# Patient Record
Sex: Male | Born: 1965 | Race: White | Hispanic: No | Marital: Single | Smoking: Current every day smoker
Health system: Southern US, Community
[De-identification: ages and names within clinical notes are randomized; demographics above are authoritative.]

## PROBLEM LIST (undated history)

## (undated) ENCOUNTER — Emergency Department (HOSPITAL_COMMUNITY): Payer: Self-pay | Source: Home / Self Care

## (undated) DIAGNOSIS — S82409A Unspecified fracture of shaft of unspecified fibula, initial encounter for closed fracture: Secondary | ICD-10-CM

## (undated) DIAGNOSIS — S82209A Unspecified fracture of shaft of unspecified tibia, initial encounter for closed fracture: Secondary | ICD-10-CM

## (undated) HISTORY — PX: BRONCHOSCOPY: SUR163

## (undated) HISTORY — PX: COLONOSCOPY: SHX174

---

## 1999-10-13 ENCOUNTER — Encounter (INDEPENDENT_AMBULATORY_CARE_PROVIDER_SITE_OTHER): Payer: Self-pay | Admitting: Specialist

## 1999-10-13 ENCOUNTER — Ambulatory Visit: Admission: RE | Admit: 1999-10-13 | Discharge: 1999-10-13 | Payer: Self-pay | Admitting: Pulmonary Disease

## 2009-06-16 ENCOUNTER — Emergency Department (HOSPITAL_COMMUNITY): Admission: EM | Admit: 2009-06-16 | Discharge: 2009-06-16 | Payer: Self-pay | Admitting: Emergency Medicine

## 2010-04-05 LAB — POCT I-STAT, CHEM 8
BUN: 6 mg/dL (ref 6–23)
Calcium, Ion: 1.08 mmol/L — ABNORMAL LOW (ref 1.12–1.32)
Chloride: 108 mEq/L (ref 96–112)
Creatinine, Ser: 1.1 mg/dL (ref 0.4–1.5)
Glucose, Bld: 89 mg/dL (ref 70–99)
Hemoglobin: 15 g/dL (ref 13.0–17.0)
TCO2: 22 mmol/L (ref 0–100)

## 2010-04-05 LAB — DIFFERENTIAL
Basophils Absolute: 0 10*3/uL (ref 0.0–0.1)
Eosinophils Absolute: 0 10*3/uL (ref 0.0–0.7)
Lymphs Abs: 1.6 10*3/uL (ref 0.7–4.0)
Monocytes Absolute: 0.7 10*3/uL (ref 0.1–1.0)
Neutrophils Relative %: 80 % — ABNORMAL HIGH (ref 43–77)

## 2010-04-05 LAB — CBC
Hemoglobin: 14.7 g/dL (ref 13.0–17.0)
MCHC: 34.3 g/dL (ref 30.0–36.0)
RBC: 4.51 MIL/uL (ref 4.22–5.81)

## 2010-04-05 LAB — ETHANOL: Alcohol, Ethyl (B): 61 mg/dL — ABNORMAL HIGH (ref 0–10)

## 2010-06-04 NOTE — Procedures (Signed)
[facility]  Patient:    Evan Valdez, Evan Valdez                            MRN: [id number] Proc. Date: 10/15/99 Adm. Date:  [id number] Disc. Date: [id number] Attending:  Merwyn Katos                           Procedure Report  DATE OF BIRTH:  1965-01-28  PROCEDURE:  Bronchoscopy.  INDICATIONS FOR PROCEDURE: 1. Persistent right upper lobe infiltrate, rule out tuberculosis versus    endobronchial obstruction. 2. Hemoptysis.  PREMEDICATION:  The patient was administered Versed 10 mg and Demerol 75 mg IV throughout the course of the procedure.  ANESTHESIA:  Topical anesthesia was applied to the nose and throat and 40 cc of 1% lidocaine were used during the course of the procedure.  DESCRIPTION OF PROCEDURE:  After adequate sedation and anesthesia, the bronchoscope was introduced via the right naris and advanced down the posterior pharynx and demonstrated a normal upper airway anatomy with vocal cords moving symmetrically. The patient had excessive cough. Further anesthesia was achieved with 1% lidocaine and with some difficulty, the cord were passed. Further airway anesthesia was achieved with 1% lidocaine. However, he could never be fully anesthetized and continued to have excessive cough and agitation throughout the course of the procedure. A complete airway examination was performed and this demonstrated severe diffuse acute and chronic bronchitis changes. His segmental airway anatomy was normal. There were no endobronchial tumors, masses or foreign bodies. There was no evidence of active hemoptysis. After completion of the airway examination, the bronchoscope was advanced into the posterior segment of the right upper lobe and bronchoalveolar lavage was performed. Then 100 cc of normal saline were instilled and approximately 25 cc returned. The returning fluid was opaque. Because of agitation, brush biopsy and transbronchial biopsies could not  be performed. The procedure was terminated. The patient tolerated the procedure well without significant complications. DD:  10/15/99 TD:  10/15/99 Job: [id number]

## 2011-06-13 ENCOUNTER — Encounter (HOSPITAL_COMMUNITY): Payer: Self-pay | Admitting: Emergency Medicine

## 2011-06-13 ENCOUNTER — Emergency Department (HOSPITAL_COMMUNITY)
Admission: EM | Admit: 2011-06-13 | Discharge: 2011-06-13 | Disposition: A | Discharge status: Home / Self Care | Payer: Self-pay | Attending: Emergency Medicine | Admitting: Emergency Medicine

## 2011-06-13 ENCOUNTER — Emergency Department (HOSPITAL_COMMUNITY): Payer: Self-pay

## 2011-06-13 DIAGNOSIS — S81019A Laceration without foreign body, unspecified knee, initial encounter: Secondary | ICD-10-CM

## 2011-06-13 DIAGNOSIS — S81809A Unspecified open wound, unspecified lower leg, initial encounter: Secondary | ICD-10-CM | POA: Insufficient documentation

## 2011-06-13 DIAGNOSIS — W298XXA Contact with other powered powered hand tools and household machinery, initial encounter: Secondary | ICD-10-CM | POA: Insufficient documentation

## 2011-06-13 DIAGNOSIS — S81009A Unspecified open wound, unspecified knee, initial encounter: Secondary | ICD-10-CM | POA: Insufficient documentation

## 2011-06-13 MED ORDER — ONDANSETRON HCL 4 MG PO TABS
4.0000 mg | ORAL_TABLET | Freq: Once | ORAL | Status: AC
  Administered 2011-06-13: 4 mg via ORAL
  Filled 2011-06-13: qty 1

## 2011-06-13 MED ORDER — MORPHINE SULFATE 10 MG/ML IJ SOLN
10.0000 mg | Freq: Once | INTRAMUSCULAR | Status: AC
  Administered 2011-06-13: 10 mg via INTRAMUSCULAR
  Filled 2011-06-13: qty 1

## 2011-06-13 MED ORDER — LIDOCAINE-EPINEPHRINE (PF) 2 %-1:200000 IJ SOLN
INTRAMUSCULAR | Status: AC
  Administered 2011-06-13: 12:00:00
  Filled 2011-06-13: qty 20

## 2011-06-13 MED ORDER — BACITRACIN ZINC 500 UNIT/GM EX OINT
TOPICAL_OINTMENT | CUTANEOUS | Status: AC
  Administered 2011-06-13: 1
  Filled 2011-06-13: qty 0.9

## 2011-06-13 MED ORDER — AMOXICILLIN-POT CLAVULANATE 875-125 MG PO TABS
1.0000 | ORAL_TABLET | Freq: Once | ORAL | Status: AC
  Administered 2011-06-13: 1 via ORAL
  Filled 2011-06-13: qty 1

## 2011-06-13 MED ORDER — HYDROCODONE-ACETAMINOPHEN 7.5-325 MG PO TABS: 1.0000 | ORAL_TABLET | ORAL | Status: AC | PRN

## 2011-06-13 MED ORDER — AMOXICILLIN 500 MG PO CAPS: ORAL_CAPSULE | ORAL | Status: DC

## 2011-06-13 NOTE — ED Notes (Addendum)
Chain saw lac to L knee. Gapping 2inch lac noted with bleeding controlled. rom wnl. Color wnl. edp in no bone exposure but appears deep. Plus 2 pulse bilateral and bounding

## 2011-06-13 NOTE — Discharge Instructions (Signed)
Please keep wound clean and dry. Return to your MD or ED to Laceration Care, Adult PLEASE USE THE KNEE IMMOBILIZER FOR THE NEXT 6 DAYS. A laceration is a cut or lesion that goes through all layers of the skin and into the tissue just beneath the skin. TREATMENT  Some lacerations may not require closure. Some lacerations may not be able to be closed due to an increased risk of infection. It is important to see your caregiver as soon as possible after an injury to minimize the risk of infection and maximize the opportunity for successful closure. If closure is appropriate, pain medicines may be given, if needed. The wound will be cleaned to help prevent infection. Your caregiver will use stitches (sutures), staples, wound glue (adhesive), or skin adhesive strips to repair the laceration. These tools bring the skin edges together to allow for faster healing and a better cosmetic outcome. However, all wounds will heal with a scar. Once the wound has healed, scarring can be minimized by covering the wound with sunscreen during the day for 1 full year. HOME CARE INSTRUCTIONS  For sutures or staples:  Keep the wound clean and dry.   If you were given a bandage (dressing), you should change it at least once a day. Also, change the dressing if it becomes wet or dirty, or as directed by your caregiver.   Wash the wound with soap and water 2 times a day. Rinse the wound off with water to remove all soap. Pat the wound dry with a clean towel.   After cleaning, apply a thin layer of the antibiotic ointment as recommended by your caregiver. This will help prevent infection and keep the dressing from sticking.   You may shower as usual after the first 24 hours. Do not soak the wound in water until the sutures are removed.   Only take over-the-counter or prescription medicines for pain, discomfort, or fever as directed by your caregiver.   Get your sutures or staples removed as directed by your caregiver.  For  skin adhesive strips:  Keep the wound clean and dry.   Do not get the skin adhesive strips wet. You may bathe carefully, using caution to keep the wound dry.   If the wound gets wet, pat it dry with a clean towel.   Skin adhesive strips will fall off on their own. You may trim the strips as the wound heals. Do not remove skin adhesive strips that are still stuck to the wound. They will fall off in time.  For wound adhesive:  You may briefly wet your wound in the shower or bath. Do not soak or scrub the wound. Do not swim. Avoid periods of heavy perspiration until the skin adhesive has fallen off on its own. After showering or bathing, gently pat the wound dry with a clean towel.   Do not apply liquid medicine, cream medicine, or ointment medicine to your wound while the skin adhesive is in place. This may loosen the film before your wound is healed.   If a dressing is placed over the wound, be careful not to apply tape directly over the skin adhesive. This may cause the adhesive to be pulled off before the wound is healed.   Avoid prolonged exposure to sunlight or tanning lamps while the skin adhesive is in place. Exposure to ultraviolet light in the first year will darken the scar.   The skin adhesive will usually remain in place for 5 to 10 days,  then naturally fall off the skin. Do not pick at the adhesive film.  You may need a tetanus shot if:  You cannot remember when you had your last tetanus shot.   You have never had a tetanus shot.  If you get a tetanus shot, your arm may swell, get red, and feel warm to the touch. This is common and not a problem. If you need a tetanus shot and you choose not to have one, there is a rare chance of getting tetanus. Sickness from tetanus can be serious. SEEK MEDICAL CARE IF:   You have redness, swelling, or increasing pain in the wound.   You see a red line that goes away from the wound.   You have yellowish-white fluid (pus) coming from the  wound.   You have a fever.   You notice a bad smell coming from the wound or dressing.   Your wound breaks open before or after sutures have been removed.   You notice something coming out of the wound such as wood or glass.   Your wound is on your hand or foot and you cannot move a finger or toe.  SEEK IMMEDIATE MEDICAL CARE IF:   Your pain is not controlled with prescribed medicine.   You have severe swelling around the wound causing pain and numbness or a change in color in your arm, hand, leg, or foot.   Your wound splits open and starts bleeding.   You have worsening numbness, weakness, or loss of function of any joint around or beyond the wound.   You develop painful lumps near the wound or on the skin anywhere on your body.  MAKE SURE YOU:   Understand these instructions.   Will watch your condition.   Will get help right away if you are not doing well or get worse.  Document Released: 01/03/2005 Document Revised: 12/23/2010 Document Reviewed: 06/29/2010 [facility] Patient Information 2012 [place], [place]. Have staples removed in 10 days. Return if any signs of infection

## 2011-06-13 NOTE — ED Notes (Signed)
Pt back from xray and pa aware suture setup ready

## 2011-06-13 NOTE — ED Provider Notes (Addendum)
History     CSN: [id number]  Arrival date & time 06/13/11  2952   First MD Initiated Contact with Patient 06/13/11 0940      No chief complaint on file.   (Consider location/radiation/quality/duration/timing/severity/associated sxs/prior treatment) HPI Comments: Pt was working with a chain saw an accidentally cut the left knee. He denies use of blood thinning medications. He is up to date on tetanus. He has not taken anything for pain. Bleeding controlled upon arrival.  The history is provided by the patient.    History reviewed. No pertinent past medical history.  History reviewed. No pertinent past surgical history.  History reviewed. No pertinent family history.  History  Substance Use Topics  . Smoking status: Current Everyday Smoker  . Smokeless tobacco: Not on file  . Alcohol Use: Yes     occasional      Review of Systems  Constitutional: Negative for activity change.       All ROS Neg except as noted in HPI  HENT: Negative for nosebleeds and neck pain.   Eyes: Negative for photophobia and discharge.  Respiratory: Negative for cough, shortness of breath and wheezing.   Cardiovascular: Negative for chest pain and palpitations.  Gastrointestinal: Negative for abdominal pain and blood in stool.  Genitourinary: Negative for dysuria, frequency and hematuria.  Musculoskeletal: Negative for back pain and arthralgias.  Skin: Negative.   Neurological: Negative for dizziness, seizures and speech difficulty.  Psychiatric/Behavioral: Negative for hallucinations and confusion.    Allergies  Review of patient's allergies indicates no known allergies.  Home Medications  No current outpatient prescriptions on file.  BP 105/71  Pulse 66  Temp(Src) 97.7 F (36.5 C) (Oral)  Resp 20  Ht 5\' 11"  (1.803 m)  Wt 160 lb (72.576 kg)  BMI 22.32 kg/m2  SpO2 99%  Physical Exam  Nursing note and vitals reviewed. Constitutional: He is oriented to person, place, and time. He  appears well-developed and well-nourished.  Non-toxic appearance.  HENT:  Head: Normocephalic.  Right Ear: Tympanic membrane and external ear normal.  Left Ear: Tympanic membrane and external ear normal.  Eyes: EOM and lids are normal. Pupils are equal, round, and reactive to light.  Neck: Normal range of motion. Neck supple. Carotid bruit is not present.  Cardiovascular: Normal rate, regular rhythm, normal heart sounds, intact distal pulses and normal pulses.   Pulmonary/Chest: Breath sounds normal. No respiratory distress.  Abdominal: Soft. Bowel sounds are normal. There is no tenderness. There is no guarding.  Musculoskeletal: Normal range of motion.       The patient has a laceration to the anterior left knee. There is no tendon involvement. No noted bone involvement. There is no crepitus appreciated. The distal pulses are symmetrical. Sensory is also symmetrical.  Lymphadenopathy:       Head (right side): No submandibular adenopathy present.       Head (left side): No submandibular adenopathy present.    He has no cervical adenopathy.  Neurological: He is alert and oriented to person, place, and time. He has normal strength. No cranial nerve deficit or sensory deficit.  Skin: Skin is warm and dry.  Psychiatric: He has a normal mood and affect. His speech is normal.    ED Course  Procedures: LACERATION REPAIR - the patient was identified by arm band. Permission for the procedure was given by the patient. Time out was taken before starting the procedure. The laceration to the left knee was painted with Betadine. It was then infiltrated  with 2% lidocaine with epinephrine. Following this it was cleansed with a Betadine surgical scrub brush. Was then irrigated with sterile saline. Following this the wound was again painted with Betadine. Draped in the usual sterile fashion. Subcutaneous area was repaired with FOUR 3-0 chromic gut sutures. The skin was then repaired with 12 staples. The woud  measures 6.7cmSterile dressing was applied by me. Patient tolerated the procedure without problem or incident. Knee immobilizer was applied by nursing staff.  Labs Reviewed - No data to display No results found.   No diagnosis found.    MDM  I have reviewed nursing notes, vital signs, and all appropriate lab and imaging results for this patient. The x-ray of the left knee is negative for air or bone involvement. The complex laceration was repaired with 3-0 chromic gut sutures and staples. The patient is to return in 10 days for staples to be removed. Patient is treated with Augmentin twice daily and Norco 7.5 every 4 hours as needed for pain. The patient is fitted with a knee immobilizer to prevent excess strain on the laceration sites.       Kathie Dike, PA 06/22/11 2028  Kathie Dike, PA 08/29/11 1610

## 2011-06-23 NOTE — ED Provider Notes (Signed)
Medical screening examination/treatment/procedure(s) were conducted as a shared visit with non-physician practitioner(s) and myself.  I personally evaluated the patient during the encounter  Knee laceration from chainsaw. Bleeding controlled.  Flexion/extension intact. +2 DP and PT pulses. Does not appear to violate joint space.  [name], MD 06/23/11 1306

## 2011-08-30 NOTE — ED Provider Notes (Signed)
Medical screening examination/treatment/procedure(s) were performed by non-physician practitioner and as supervising physician I was immediately available for consultation/collaboration.   Mercedez Boule, MD 08/30/11 1416 

## 2012-08-09 ENCOUNTER — Encounter (HOSPITAL_COMMUNITY): Payer: Self-pay | Admitting: *Deleted

## 2012-08-09 ENCOUNTER — Emergency Department (HOSPITAL_COMMUNITY)
Admission: EM | Admit: 2012-08-09 | Discharge: 2012-08-09 | Disposition: A | Discharge status: Home / Self Care | Payer: Self-pay | Attending: Emergency Medicine | Admitting: Emergency Medicine

## 2012-08-09 ENCOUNTER — Emergency Department (HOSPITAL_COMMUNITY): Payer: Self-pay

## 2012-08-09 DIAGNOSIS — S5291XA Unspecified fracture of right forearm, initial encounter for closed fracture: Secondary | ICD-10-CM

## 2012-08-09 DIAGNOSIS — F17200 Nicotine dependence, unspecified, uncomplicated: Secondary | ICD-10-CM | POA: Insufficient documentation

## 2012-08-09 DIAGNOSIS — S0101XA Laceration without foreign body of scalp, initial encounter: Secondary | ICD-10-CM

## 2012-08-09 DIAGNOSIS — Z23 Encounter for immunization: Secondary | ICD-10-CM | POA: Insufficient documentation

## 2012-08-09 DIAGNOSIS — S0100XA Unspecified open wound of scalp, initial encounter: Secondary | ICD-10-CM | POA: Insufficient documentation

## 2012-08-09 DIAGNOSIS — S52209A Unspecified fracture of shaft of unspecified ulna, initial encounter for closed fracture: Secondary | ICD-10-CM | POA: Insufficient documentation

## 2012-08-09 DIAGNOSIS — S0990XA Unspecified injury of head, initial encounter: Secondary | ICD-10-CM | POA: Insufficient documentation

## 2012-08-09 LAB — CBC WITH DIFFERENTIAL/PLATELET
Basophils Absolute: 0.1 10*3/uL (ref 0.0–0.1)
Basophils Relative: 1 % (ref 0–1)
Lymphocytes Relative: 25 % (ref 12–46)
MCHC: 35.3 g/dL (ref 30.0–36.0)
Neutro Abs: 6.7 10*3/uL (ref 1.7–7.7)
Neutrophils Relative %: 67 % (ref 43–77)
Platelets: 232 10*3/uL (ref 150–400)
RDW: 12.7 % (ref 11.5–15.5)
WBC: 9.9 10*3/uL (ref 4.0–10.5)

## 2012-08-09 LAB — COMPREHENSIVE METABOLIC PANEL
ALT: 43 U/L (ref 0–53)
AST: 43 U/L — ABNORMAL HIGH (ref 0–37)
Albumin: 4.2 g/dL (ref 3.5–5.2)
CO2: 26 mEq/L (ref 19–32)
Chloride: 105 mEq/L (ref 96–112)
Creatinine, Ser: 0.93 mg/dL (ref 0.50–1.35)
GFR calc non Af Amer: >90 mL/min (ref >90)
Sodium: 141 mEq/L (ref 135–145)
Total Bilirubin: 0.6 mg/dL (ref 0.3–1.2)

## 2012-08-09 MED ORDER — ONDANSETRON HCL 4 MG/2ML IJ SOLN
4.0000 mg | Freq: Once | INTRAMUSCULAR | Status: AC
  Administered 2012-08-09: 4 mg via INTRAVENOUS
  Filled 2012-08-09: qty 2

## 2012-08-09 MED ORDER — CEFAZOLIN SODIUM-DEXTROSE 2-3 GM-% IV SOLR
2.0000 g | Freq: Once | INTRAVENOUS | Status: DC
  Filled 2012-08-09: qty 50

## 2012-08-09 MED ORDER — HYDROCODONE-ACETAMINOPHEN 5-325 MG PO TABS: 1.0000 | ORAL_TABLET | ORAL | Status: DC | PRN

## 2012-08-09 MED ORDER — MORPHINE SULFATE 4 MG/ML IJ SOLN
4.0000 mg | Freq: Once | INTRAMUSCULAR | Status: AC
  Administered 2012-08-09: 4 mg via INTRAVENOUS
  Filled 2012-08-09: qty 1

## 2012-08-09 MED ORDER — SODIUM CHLORIDE 0.9 % IV SOLN
INTRAVENOUS | Status: DC
  Administered 2012-08-09: 15:00:00 via INTRAVENOUS
  Filled 2012-08-09: qty 1000

## 2012-08-09 MED ORDER — LIDOCAINE-EPINEPHRINE (PF) 2 %-1:200000 IJ SOLN
INTRAMUSCULAR | Status: AC
  Filled 2012-08-09: qty 20

## 2012-08-09 MED ORDER — CEFAZOLIN SODIUM-DEXTROSE 2-3 GM-% IV SOLR
INTRAVENOUS | Status: AC
  Filled 2012-08-09: qty 50

## 2012-08-09 MED ORDER — LIDOCAINE-EPINEPHRINE (PF) 2 %-1:200000 IJ SOLN
INTRAMUSCULAR | Status: AC
  Administered 2012-08-09: 20 mL
  Filled 2012-08-09: qty 20

## 2012-08-09 MED ORDER — TETANUS-DIPHTHERIA TOXOIDS TD 5-2 LFU IM INJ
0.5000 mL | INJECTION | Freq: Once | INTRAMUSCULAR | Status: AC
  Administered 2012-08-09: 0.5 mL via INTRAMUSCULAR
  Filled 2012-08-09: qty 0.5
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
## 2012-08-09 NOTE — ED Notes (Signed)
Patient transported to X-ray 
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
## 2012-08-09 NOTE — ED Notes (Signed)
Patient transported to CT 

## 2012-08-10 ENCOUNTER — Observation Stay (HOSPITAL_COMMUNITY)
Admission: RE | Admit: 2012-08-10 | Discharge: 2012-08-11 | Disposition: A | Discharge status: Home / Self Care | Payer: MEDICAID | Source: Ambulatory Visit | Attending: Orthopaedic Surgery | Admitting: Orthopaedic Surgery

## 2012-08-10 ENCOUNTER — Encounter (HOSPITAL_COMMUNITY): Payer: Self-pay | Admitting: Anesthesiology

## 2012-08-10 ENCOUNTER — Encounter (HOSPITAL_COMMUNITY): Payer: Self-pay

## 2012-08-10 ENCOUNTER — Ambulatory Visit (HOSPITAL_COMMUNITY): Payer: Self-pay | Admitting: Anesthesiology

## 2012-08-10 ENCOUNTER — Ambulatory Visit (HOSPITAL_COMMUNITY): Payer: Self-pay

## 2012-08-10 ENCOUNTER — Encounter (HOSPITAL_COMMUNITY)
Admission: RE | Disposition: A | Discharge status: Home / Self Care | Payer: Self-pay | Source: Ambulatory Visit | Attending: Orthopaedic Surgery

## 2012-08-10 DIAGNOSIS — S52201D Unspecified fracture of shaft of right ulna, subsequent encounter for closed fracture with routine healing: Secondary | ICD-10-CM

## 2012-08-10 DIAGNOSIS — S52009A Unspecified fracture of upper end of unspecified ulna, initial encounter for closed fracture: Principal | ICD-10-CM | POA: Insufficient documentation

## 2012-08-10 DIAGNOSIS — X58XXXA Exposure to other specified factors, initial encounter: Secondary | ICD-10-CM | POA: Insufficient documentation

## 2012-08-10 DIAGNOSIS — Z01812 Encounter for preprocedural laboratory examination: Secondary | ICD-10-CM | POA: Insufficient documentation

## 2012-08-10 HISTORY — PX: ORIF ULNAR FRACTURE: SHX5417

## 2012-08-10 SURGERY — OPEN REDUCTION INTERNAL FIXATION (ORIF) ULNAR FRACTURE
Anesthesia: General | Site: Arm Lower | Laterality: Right | Wound class: Clean

## 2012-08-10 MED ORDER — ONDANSETRON HCL 4 MG/2ML IJ SOLN: 4.0000 mg | Freq: Once | INTRAMUSCULAR | Status: DC | PRN

## 2012-08-10 MED ORDER — DIPHENHYDRAMINE HCL 50 MG/ML IJ SOLN: 12.5000 mg | Freq: Four times a day (QID) | INTRAMUSCULAR | Status: DC | PRN

## 2012-08-10 MED ORDER — SODIUM CHLORIDE 0.9 % IJ SOLN: 9.0000 mL | INTRAMUSCULAR | Status: DC | PRN

## 2012-08-10 MED ORDER — FENTANYL CITRATE 0.05 MG/ML IJ SOLN
25.0000 ug | INTRAMUSCULAR | Status: AC
  Administered 2012-08-10 (×2): 25 ug via INTRAVENOUS

## 2012-08-10 MED ORDER — ENOXAPARIN SODIUM 40 MG/0.4ML ~~LOC~~ SOLN
40.0000 mg | SUBCUTANEOUS | Status: DC
  Administered 2012-08-11: 40 mg via SUBCUTANEOUS
  Filled 2012-08-10: qty 0.4

## 2012-08-10 MED ORDER — ALBUTEROL SULFATE (5 MG/ML) 0.5% IN NEBU: 2.5000 mg | INHALATION_SOLUTION | Freq: Four times a day (QID) | RESPIRATORY_TRACT | Status: DC | PRN

## 2012-08-10 MED ORDER — 0.9 % SODIUM CHLORIDE (POUR BTL) OPTIME
TOPICAL | Status: DC | PRN
  Administered 2012-08-10: 1000 mL

## 2012-08-10 MED ORDER — MIDAZOLAM HCL 2 MG/2ML IJ SOLN
1.0000 mg | INTRAMUSCULAR | Status: DC | PRN
  Administered 2012-08-10: 2 mg via INTRAVENOUS

## 2012-08-10 MED ORDER — FENTANYL CITRATE 0.05 MG/ML IJ SOLN
INTRAMUSCULAR | Status: AC
  Filled 2012-08-10: qty 2

## 2012-08-10 MED ORDER — PROMETHAZINE HCL 25 MG/ML IJ SOLN
12.5000 mg | INTRAMUSCULAR | Status: DC | PRN
  Administered 2012-08-11: 12.5 mg via INTRAMUSCULAR
  Filled 2012-08-10: qty 1

## 2012-08-10 MED ORDER — LACTATED RINGERS IV SOLN
INTRAVENOUS | Status: DC
  Administered 2012-08-10: 10:00:00 via INTRAVENOUS

## 2012-08-10 MED ORDER — LIDOCAINE HCL (CARDIAC) 10 MG/ML IV SOLN
INTRAVENOUS | Status: DC | PRN
  Administered 2012-08-10: 20 mg via INTRAVENOUS

## 2012-08-10 MED ORDER — ONDANSETRON HCL 4 MG/2ML IJ SOLN: 4.0000 mg | Freq: Four times a day (QID) | INTRAMUSCULAR | Status: DC | PRN

## 2012-08-10 MED ORDER — MORPHINE SULFATE (PF) 1 MG/ML IV SOLN
INTRAVENOUS | Status: DC
Start: 2012-08-10 — End: 2012-08-11
  Administered 2012-08-10 – 2012-08-11 (×3): via INTRAVENOUS
  Administered 2012-08-11: 44.16 mg via INTRAVENOUS
  Administered 2012-08-11: 5.47 mg via INTRAVENOUS
  Administered 2012-08-11: 02:00:00 via INTRAVENOUS
  Administered 2012-08-11: 24.86 mg via INTRAVENOUS
  Filled 2012-08-10 (×4): qty 25

## 2012-08-10 MED ORDER — MIDAZOLAM HCL 2 MG/2ML IJ SOLN
INTRAMUSCULAR | Status: AC
  Filled 2012-08-10: qty 2

## 2012-08-10 MED ORDER — FENTANYL CITRATE 0.05 MG/ML IJ SOLN
25.0000 ug | INTRAMUSCULAR | Status: DC | PRN
  Administered 2012-08-10 (×4): 50 ug via INTRAVENOUS

## 2012-08-10 MED ORDER — MAGNESIUM HYDROXIDE 400 MG/5ML PO SUSP: 30.0000 mL | Freq: Every day | ORAL | Status: DC | PRN

## 2012-08-10 MED ORDER — ALBUTEROL SULFATE (5 MG/ML) 0.5% IN NEBU
2.5000 mg | INHALATION_SOLUTION | Freq: Four times a day (QID) | RESPIRATORY_TRACT | Status: DC
  Administered 2012-08-10 (×2): 2.5 mg via RESPIRATORY_TRACT
  Filled 2012-08-10 (×2): qty 0.5

## 2012-08-10 MED ORDER — FENTANYL CITRATE 0.05 MG/ML IJ SOLN
INTRAMUSCULAR | Status: DC | PRN
  Administered 2012-08-10 (×4): 50 ug via INTRAVENOUS
  Administered 2012-08-10: 100 ug via INTRAVENOUS

## 2012-08-10 MED ORDER — NALOXONE HCL 0.4 MG/ML IJ SOLN: 0.4000 mg | INTRAMUSCULAR | Status: DC | PRN

## 2012-08-10 MED ORDER — DEXTROSE-NACL 5-0.45 % IV SOLN
INTRAVENOUS | Status: DC
  Administered 2012-08-10 – 2012-08-11 (×2): via INTRAVENOUS

## 2012-08-10 MED ORDER — ACETAMINOPHEN 10 MG/ML IV SOLN
1000.0000 mg | Freq: Four times a day (QID) | INTRAVENOUS | Status: DC
  Administered 2012-08-10 – 2012-08-11 (×3): 1000 mg via INTRAVENOUS
  Filled 2012-08-10 (×4): qty 100

## 2012-08-10 MED ORDER — CEFAZOLIN SODIUM 1-5 GM-% IV SOLN
INTRAVENOUS | Status: DC | PRN
  Administered 2012-08-10: 1 g via INTRAVENOUS

## 2012-08-10 MED ORDER — ZOLPIDEM TARTRATE 5 MG PO TABS
5.0000 mg | ORAL_TABLET | Freq: Every day | ORAL | Status: DC
  Administered 2012-08-10: 5 mg via ORAL
  Filled 2012-08-10: qty 1

## 2012-08-10 MED ORDER — DIPHENHYDRAMINE HCL 12.5 MG/5ML PO ELIX: 12.5000 mg | ORAL_SOLUTION | Freq: Four times a day (QID) | ORAL | Status: DC | PRN

## 2012-08-10 MED ORDER — PROPOFOL 10 MG/ML IV BOLUS
INTRAVENOUS | Status: DC | PRN
  Administered 2012-08-10: 20 mg via INTRAVENOUS
  Administered 2012-08-10: 150 mg via INTRAVENOUS
  Administered 2012-08-10: 30 mg via INTRAVENOUS

## 2012-08-10 SURGICAL SUPPLY — 41 items
BAG HAMPER (MISCELLANEOUS) ×1 IMPLANT
BANDAGE ELASTIC 4 VELCRO NS (GAUZE/BANDAGES/DRESSINGS) ×2 IMPLANT
BIT DRILL 2.5X110 QC LCP DISP (BIT) ×1 IMPLANT
BIT DRILL 2.8 (BIT) ×1
BIT DRILL CANN QC 2.8X165 (BIT) IMPLANT
BNDG COHESIVE 4X5 TAN NS LF (GAUZE/BANDAGES/DRESSINGS) ×1 IMPLANT
CLOTH BEACON ORANGE TIMEOUT ST (SAFETY) ×1 IMPLANT
COVER LIGHT HANDLE STERIS (MISCELLANEOUS) ×4 IMPLANT
CUFF TOURNIQUET SINGLE 18IN (TOURNIQUET CUFF) ×1 IMPLANT
DRAPE C-ARM FOLDED MOBILE STRL (DRAPES) ×1 IMPLANT
DRAPE X-RAY CASS 24X20 (DRAPES) ×1 IMPLANT
DRILL BIT 2.8MM (BIT) ×2
GAUZE XEROFORM 5X9 LF (GAUZE/BANDAGES/DRESSINGS) ×1 IMPLANT
GLOVE BIO SURGEON STRL SZ8 (GLOVE) ×1 IMPLANT
GOWN STRL REIN XL XLG (GOWN DISPOSABLE) ×3 IMPLANT
MANIFOLD NEPTUNE II (INSTRUMENTS) ×1 IMPLANT
NS IRRIG 1000ML POUR BTL (IV SOLUTION) ×1 IMPLANT
PACK BASIC LIMB (CUSTOM PROCEDURE TRAY) ×1 IMPLANT
PAD ABD 5X9 TENDERSORB (GAUZE/BANDAGES/DRESSINGS) ×2 IMPLANT
PAD CAST 4YDX4 CTTN HI CHSV (CAST SUPPLIES) IMPLANT
PADDING CAST COTTON 4X4 STRL (CAST SUPPLIES) ×4
PROS LCP PLATE 6H 85MM (Plate) ×2 IMPLANT
PROSTHESIS LCP PLATE 6H 85MM (Plate) IMPLANT
SCREW CORTEX 3.5 20MM (Screw) ×1 IMPLANT
SCREW CORTEX 3.5 22MM (Screw) ×4 IMPLANT
SCREW LOCK CORT ST 3.5X20 (Screw) IMPLANT
SCREW LOCK CORT ST 3.5X22 (Screw) IMPLANT
SCREW LOCK T15 FT 40X3.5XST (Screw) IMPLANT
SCREW LOCKING 3.5X40 (Screw) ×2 IMPLANT
SET BASIN LINEN APH (SET/KITS/TRAYS/PACK) ×1 IMPLANT
SOL PREP PROV IODINE SCRUB 4OZ (MISCELLANEOUS) ×1 IMPLANT
SPLINT IMMOBILIZER J 3INX20FT (CAST SUPPLIES) ×1
SPLINT J IMMOBILIZER 3X20FT (CAST SUPPLIES) IMPLANT
SPONGE GAUZE 4X4 12PLY (GAUZE/BANDAGES/DRESSINGS) ×2 IMPLANT
SPONGE LAP 18X18 X RAY DECT (DISPOSABLE) ×1 IMPLANT
STAPLER VISISTAT 35W (STAPLE) ×1 IMPLANT
SUT CHROMIC 2 0 CT 1 (SUTURE) ×3 IMPLANT
SUT PLAIN CT 1/2CIR 2-0 27IN (SUTURE) ×1 IMPLANT
SYR BULB IRRIGATION 50ML (SYRINGE) ×1 IMPLANT
TOWEL OR 17X26 4PK STRL BLUE (TOWEL DISPOSABLE) ×1 IMPLANT
WATER STERILE IRR 1000ML POUR (IV SOLUTION) ×1 IMPLANT

## 2012-08-10 NOTE — Anesthesia Preprocedure Evaluation (Signed)
Anesthesia Evaluation  Patient identified by MRN, date of birth, ID band Patient awake    Reviewed: Allergy & Precautions, H&P , NPO status , Patient's Chart, lab work & pertinent test results  History of Anesthesia Complications Negative for: history of anesthetic complications  Airway Mallampati: II TM Distance: >3 FB     Dental  (+) Teeth Intact, Implants and Missing   Pulmonary Current Smoker (am cough),  breath sounds clear to auscultation        Cardiovascular negative cardio ROS  Rhythm:Regular Rate:Normal     Neuro/Psych No LOC     GI/Hepatic negative GI ROS,   Endo/Other    Renal/GU      Musculoskeletal   Abdominal   Peds  Hematology   Anesthesia Other Findings   Reproductive/Obstetrics                           Anesthesia Physical Anesthesia Plan  ASA: II  Anesthesia Plan: General   Post-op Pain Management:    Induction: Intravenous  Airway Management Planned: LMA  Additional Equipment:   Intra-op Plan:   Post-operative Plan: Extubation in OR  Informed Consent: I have reviewed the patients History and Physical, chart, labs and discussed the procedure including the risks, benefits and alternatives for the proposed anesthesia with the patient or authorized representative who has indicated his/her understanding and acceptance.     Plan Discussed with:   Anesthesia Plan Comments:         Anesthesia Quick Evaluation

## 2012-08-10 NOTE — Transfer of Care (Signed)
Immediate Anesthesia Transfer of Care Note  Patient: Evan Valdez  Procedure(s) Performed: Procedure(s) (LRB): OPEN REDUCTION INTERNAL FIXATION (ORIF) ULNAR FRACTURE (Right)  Patient Location: PACU  Anesthesia Type: General  Level of Consciousness: awake  Airway & Oxygen Therapy: Patient Spontanous Breathing and non-rebreather face mask  Post-op Assessment: Report given to PACU RN, Post -op Vital signs reviewed and stable and Patient moving all extremities  Post vital signs: Reviewed and stable  Complications: No apparent anesthesia complications

## 2012-08-10 NOTE — Anesthesia Postprocedure Evaluation (Signed)
  Anesthesia Post-op Note  Patient: Evan Valdez  Procedure(s) Performed: Procedure(s): OPEN REDUCTION INTERNAL FIXATION (ORIF) ULNAR FRACTURE (Right)  Patient Location: PACU  Anesthesia Type:General  Level of Consciousness: awake and patient cooperative  Airway and Oxygen Therapy: Patient Spontanous Breathing and non-rebreather face mask  Post-op Pain: moderate  Post-op Assessment: Post-op Vital signs reviewed, Patient's Cardiovascular Status Stable, Respiratory Function Stable and Patent Airway  Post-op Vital Signs: Reviewed and stable  Complications: No apparent anesthesia complications

## 2012-08-10 NOTE — Progress Notes (Signed)
The History and Physical is unchanged. I have examined the patient. The patient is medically able to have surgery on the right arm . Evan Valdez

## 2012-08-10 NOTE — Brief Op Note (Signed)
08/10/2012  12:49 PM  PATIENT:  Evan Valdez  47 y.o. male  PRE-OPERATIVE DIAGNOSIS:  Right proximal third mid third ulnar fracture  POST-OPERATIVE DIAGNOSIS:  Right proximal third mid third ulnar fracture  PROCEDURE:  Procedure(s): OPEN REDUCTION INTERNAL FIXATION (ORIF) ULNAR FRACTURE (Right)  SURGEON:  Surgeon(s) and Role:    * Darreld Mclean, MD - Primary  PHYSICIAN ASSISTANT:   ASSISTANTS: Wrenn   ANESTHESIA:   general  EBL:  Total I/O In: 500 [I.V.:500] Out: 0   BLOOD ADMINISTERED:none  DRAINS: none   LOCAL MEDICATIONS USED:  NONE  SPECIMEN:  No Specimen  DISPOSITION OF SPECIMEN:  N/A  COUNTS:  YES  TOURNIQUET:  * Missing tourniquet times found for documented tourniquets in log:  [id number] * Total Tourniquet Time Documented: Upper Arm (Right) - 2 minutes Total: Upper Arm (Right) - 2 minutes   DICTATION: .Other Dictation: Dictation Number [id number]  PLAN OF CARE: Admit for overnight observation  PATIENT DISPOSITION:  PACU - guarded condition.   Delay start of Pharmacological VTE agent (>24hrs) due to surgical blood loss or risk of bleeding: no

## 2012-08-10 NOTE — Anesthesia Procedure Notes (Signed)
Procedure Name: LMA Insertion Date/Time: 08/10/2012 11:31 AM Performed by: Franco Nones Pre-anesthesia Checklist: Patient identified, Patient being monitored, Emergency Drugs available, Timeout performed and Suction available Patient Re-evaluated:Patient Re-evaluated prior to inductionOxygen Delivery Method: Circle System Utilized Preoxygenation: Pre-oxygenation with 100% oxygen Intubation Type: IV induction Ventilation: Mask ventilation without difficulty LMA: LMA inserted LMA Size: 4.0 Number of attempts: 1 Placement Confirmation: positive ETCO2 and breath sounds checked- equal and bilateral
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
## 2012-08-11 LAB — CBC WITH DIFFERENTIAL/PLATELET
Basophils Relative: 0 % (ref 0–1)
Eosinophils Absolute: 0.1 10*3/uL (ref 0.0–0.7)
Eosinophils Relative: 1 % (ref 0–5)
Lymphs Abs: 1.3 10*3/uL (ref 0.7–4.0)
MCH: 32 pg (ref 26.0–34.0)
MCHC: 34.1 g/dL (ref 30.0–36.0)
MCV: 94.1 fL (ref 78.0–100.0)
Neutrophils Relative %: 78 % — ABNORMAL HIGH (ref 43–77)
Platelets: 214 10*3/uL (ref 150–400)
RBC: 4.4 MIL/uL (ref 4.22–5.81)

## 2012-08-11 MED ORDER — LORAZEPAM 2 MG/ML IJ SOLN
1.0000 mg | Freq: Once | INTRAMUSCULAR | Status: AC
  Administered 2012-08-11: 1 mg via INTRAVENOUS
  Filled 2012-08-11: qty 1

## 2012-08-11 MED ORDER — NICOTINE 21 MG/24HR TD PT24
21.0000 mg | MEDICATED_PATCH | Freq: Every day | TRANSDERMAL | Status: DC
  Administered 2012-08-11: 21 mg via TRANSDERMAL
  Filled 2012-08-11 (×2): qty 1

## 2012-08-11 MED ORDER — OXYCODONE-ACETAMINOPHEN 7.5-325 MG PO TABS: 1.0000 | ORAL_TABLET | ORAL | Status: DC | PRN

## 2012-08-11 NOTE — Progress Notes (Signed)
Pt. D/c instructions provided, pt. Encouraged to stay. Pt. Had two episodes of emesis this morning, nausea medication given but pt. Insisted on leaving. Pt. D/c with prescription and encouraged to keep follow up apt. MD instructions overviewed with patient along with cast care.

## 2012-08-11 NOTE — Progress Notes (Signed)
Subjective: 1 Day Post-Op Procedure(s) (LRB): OPEN REDUCTION INTERNAL FIXATION (ORIF) ULNAR FRACTURE (Right) Patient reports pain as 3 on 0-10 scale.    Objective: Vital signs in last 24 hours: Temp:  [97.6 F (36.4 C)-98.4 F (36.9 C)] 97.7 F (36.5 C) (07/26 0500) Pulse Rate:  [54-89] 75 (07/26 0500) Resp:  [9-28] 16 (07/26 0500) BP: (95-125)/(58-75) 115/67 mmHg (07/26 0500) SpO2:  [92 %-100 %] 97 % (07/26 0500) Weight:  [72.576 kg (160 lb)] 72.576 kg (160 lb) (07/25 0933)  Intake/Output from previous day: 07/25 0701 - 07/26 0700 In: 4558.8 [P.O.:2050; I.V.:2158.8; IV Piggyback:350] Out: 0  Intake/Output this shift:     Recent Labs  08/09/12 1759 08/11/12 0518  HGB 15.5 14.1    Recent Labs  08/09/12 1759 08/11/12 0518  WBC 9.9 9.6  RBC 4.74 4.40  HCT 43.9 41.4  PLT 232 214    Recent Labs  08/09/12 1759  NA 141  K 3.8  CL 105  CO2 26  BUN 11  CREATININE 0.93  GLUCOSE 103*  CALCIUM 9.2   No results found for this basename: LABPT, INR,  in the last 72 hours  Neurologically intact Neurovascular intact Sensation intact distally Intact pulses distally  Can extend fingers with no difficulty.  Assessment/Plan: 1 Day Post-Op Procedure(s) (LRB): OPEN REDUCTION INTERNAL FIXATION (ORIF) ULNAR FRACTURE (Right) Discharge home. To see in office in two weeks.  Evan Valdez 08/11/2012, 8:38 AM

## 2012-08-11 NOTE — Discharge Summary (Signed)
Physician Discharge Summary  Patient ID: ANGELA PLATNER MRN: [id number] DOB/AGE: [birth year] 47 y.o.  Admit date: 08/10/2012 Discharge date: 08/11/2012  Admission Diagnoses: Fracture of the ulna diaphysis mid third proximal third displaced right side  Discharge Diagnoses: Same Active Problems:   * No active hospital problems. *   Discharged Condition: good  Hospital Course: He had surgery on the day of the observation admission.  He had repair of the ulnar shaft fracture on the right.  He tolerated the procedure well.  He was placed on observation and had PCA morphine which controlled the pain.  Neurovascular is intact.  He insisted on going home the day after surgery.  His neurovascular was intact.  He Had good flexion and extension of the fingers.  He was given a nicotine patch during the morning of discharge.  He is a smoker.  Instructions given for care of the arm.  Consults: None  Significant Diagnostic Studies: labs: normal and radiology: X-Ray: shows post fixation and reduction of the right ulna shaft fracture in good position and alignment.  Treatments: surgery: open treatment and internal fixation of the right ulna shaft fracture  Discharge Exam: Blood pressure 115/67, pulse 75, temperature 97.7 F (36.5 C), temperature source Oral, resp. rate 16, height 5\' 11"  (1.803 m), weight 72.576 kg (160 lb), SpO2 97.00%. General appearance: alert and cooperative.  Neurovascular intact to the right hand and fingers.  Extension and flexion intact.  Sugar tong splint intact.  Disposition: 01-Home or Self Care  Discharge Orders   Future Orders Complete By Expires     Call MD / Call 911  As directed     Comments:      If you experience chest pain or shortness of breath, CALL 911 and be transported to the hospital emergency room.  If you develope a fever above 101 F, pus (white drainage) or increased drainage or redness at the wound, or calf pain, call your surgeon's office.    Constipation  Prevention  As directed     Comments:      Drink plenty of fluids.  Prune juice may be helpful.  You may use a stool softener, such as Colace (over the counter) 100 mg twice a day.  Use MiraLax (over the counter) for constipation as needed.    Diet - low sodium heart healthy  As directed     Discharge instructions  As directed     Comments:      Keep splint on and intact.  Do no remove.  Keep splint dry.  Move fingers often.  Appointment with Dr. Hilda Lias in two weeks.  Call [phone] for appointment.  Call Dr. Sanjuan Dame office if any problem.  If office closed, call hospital at [phone].    Increase activity slowly as tolerated  As directed         Medication List    STOP taking these medications       HYDROcodone-acetaminophen 5-325 MG per tablet  Commonly known as:  NORCO/VICODIN      TAKE these medications       oxyCODONE-acetaminophen 7.5-325 MG per tablet  Commonly known as:  PERCOCET  Take 1 tablet by mouth every 4 (four) hours as needed for pain.           Follow-up Information   Follow up with Darreld Mclean, MD In 2 weeks.   Contact information:   [address] [place] [address] [phone]       Signed: Darreld Mclean 08/11/2012, 8:45  AM

## 2012-08-11 NOTE — Progress Notes (Signed)
Utilization review complete 
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
## 2012-08-13 ENCOUNTER — Encounter (HOSPITAL_COMMUNITY): Payer: Self-pay | Admitting: Orthopaedic Surgery

## 2012-08-20 ENCOUNTER — Emergency Department (HOSPITAL_COMMUNITY)
Admission: EM | Admit: 2012-08-20 | Discharge: 2012-08-20 | Disposition: A | Discharge status: Home / Self Care | Payer: Self-pay | Attending: Emergency Medicine | Admitting: Emergency Medicine

## 2012-08-20 DIAGNOSIS — F17200 Nicotine dependence, unspecified, uncomplicated: Secondary | ICD-10-CM | POA: Insufficient documentation

## 2012-08-20 DIAGNOSIS — S161XXA Strain of muscle, fascia and tendon at neck level, initial encounter: Secondary | ICD-10-CM

## 2012-08-20 DIAGNOSIS — Z4802 Encounter for removal of sutures: Secondary | ICD-10-CM | POA: Insufficient documentation

## 2012-08-20 DIAGNOSIS — S139XXA Sprain of joints and ligaments of unspecified parts of neck, initial encounter: Secondary | ICD-10-CM | POA: Insufficient documentation

## 2012-08-20 MED ORDER — OXYCODONE-ACETAMINOPHEN 7.5-325 MG PO TABS: 1.0000 | ORAL_TABLET | ORAL | Status: DC | PRN

## 2012-08-20 MED ORDER — CYCLOBENZAPRINE HCL 5 MG PO TABS: 5.0000 mg | ORAL_TABLET | Freq: Three times a day (TID) | ORAL | Status: DC | PRN

## 2012-08-20 NOTE — ED Provider Notes (Signed)
Medical screening examination/treatment/procedure(s) were performed by non-physician practitioner and as supervising physician I was immediately available for consultation/collaboration.   Eniya Cannady, MD 08/20/12 1336 
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
## 2012-08-20 NOTE — ED Notes (Signed)
States he is here for staple removal 

## 2012-08-28 ENCOUNTER — Ambulatory Visit (HOSPITAL_COMMUNITY)
Admission: RE | Admit: 2012-08-28 | Discharge: 2012-08-28 | Disposition: A | Discharge status: Home / Self Care | Payer: Self-pay | Source: Ambulatory Visit | Attending: Orthopaedic Surgery | Admitting: Orthopaedic Surgery

## 2012-08-28 ENCOUNTER — Other Ambulatory Visit (HOSPITAL_COMMUNITY): Payer: Self-pay | Admitting: Orthopaedic Surgery

## 2012-08-28 DIAGNOSIS — T148XXA Other injury of unspecified body region, initial encounter: Secondary | ICD-10-CM

## 2012-08-28 DIAGNOSIS — X58XXXA Exposure to other specified factors, initial encounter: Secondary | ICD-10-CM | POA: Insufficient documentation

## 2012-08-28 DIAGNOSIS — S5290XA Unspecified fracture of unspecified forearm, initial encounter for closed fracture: Secondary | ICD-10-CM | POA: Insufficient documentation

## 2012-09-19 ENCOUNTER — Ambulatory Visit (HOSPITAL_COMMUNITY)
Admission: RE | Admit: 2012-09-19 | Discharge: 2012-09-19 | Disposition: A | Discharge status: Home / Self Care | Payer: MEDICAID | Source: Ambulatory Visit | Attending: Orthopaedic Surgery | Admitting: Orthopaedic Surgery

## 2012-09-19 ENCOUNTER — Other Ambulatory Visit (HOSPITAL_COMMUNITY): Payer: Self-pay | Admitting: Orthopaedic Surgery

## 2012-09-19 DIAGNOSIS — S52202A Unspecified fracture of shaft of left ulna, initial encounter for closed fracture: Secondary | ICD-10-CM

## 2012-09-19 DIAGNOSIS — Z4789 Encounter for other orthopedic aftercare: Secondary | ICD-10-CM | POA: Insufficient documentation

## 2012-10-17 ENCOUNTER — Ambulatory Visit (HOSPITAL_COMMUNITY)
Admission: RE | Admit: 2012-10-17 | Discharge: 2012-10-17 | Disposition: A | Discharge status: Home / Self Care | Payer: MEDICAID | Source: Ambulatory Visit | Attending: Orthopaedic Surgery | Admitting: Orthopaedic Surgery

## 2012-10-17 ENCOUNTER — Other Ambulatory Visit (HOSPITAL_COMMUNITY): Payer: Self-pay | Admitting: Orthopaedic Surgery

## 2012-10-17 DIAGNOSIS — T148XXA Other injury of unspecified body region, initial encounter: Secondary | ICD-10-CM

## 2012-10-17 DIAGNOSIS — Z4789 Encounter for other orthopedic aftercare: Secondary | ICD-10-CM | POA: Insufficient documentation

## 2013-07-10 ENCOUNTER — Encounter (INDEPENDENT_AMBULATORY_CARE_PROVIDER_SITE_OTHER): Payer: Self-pay

## 2013-07-10 ENCOUNTER — Other Ambulatory Visit (HOSPITAL_COMMUNITY): Payer: Self-pay | Admitting: Orthopaedic Surgery

## 2013-07-10 ENCOUNTER — Ambulatory Visit (HOSPITAL_COMMUNITY)
Admission: RE | Admit: 2013-07-10 | Discharge: 2013-07-10 | Disposition: A | Discharge status: Home / Self Care | Payer: Self-pay | Source: Ambulatory Visit | Attending: Orthopaedic Surgery | Admitting: Orthopaedic Surgery

## 2013-07-10 DIAGNOSIS — M79631 Pain in right forearm: Secondary | ICD-10-CM

## 2013-07-10 DIAGNOSIS — S5290XD Unspecified fracture of unspecified forearm, subsequent encounter for closed fracture with routine healing: Secondary | ICD-10-CM | POA: Insufficient documentation

## 2014-08-02 ENCOUNTER — Emergency Department (HOSPITAL_COMMUNITY): Payer: Self-pay

## 2014-08-02 ENCOUNTER — Encounter (HOSPITAL_COMMUNITY): Payer: Self-pay | Admitting: Emergency Medicine

## 2014-08-02 ENCOUNTER — Emergency Department (HOSPITAL_COMMUNITY)
Admission: EM | Admit: 2014-08-02 | Discharge: 2014-08-02 | Disposition: A | Discharge status: Home / Self Care | Payer: Self-pay | Attending: Emergency Medicine | Admitting: Emergency Medicine

## 2014-08-02 DIAGNOSIS — Y998 Other external cause status: Secondary | ICD-10-CM | POA: Insufficient documentation

## 2014-08-02 DIAGNOSIS — Y9289 Other specified places as the place of occurrence of the external cause: Secondary | ICD-10-CM | POA: Insufficient documentation

## 2014-08-02 DIAGNOSIS — S82401A Unspecified fracture of shaft of right fibula, initial encounter for closed fracture: Secondary | ICD-10-CM

## 2014-08-02 DIAGNOSIS — S80211A Abrasion, right knee, initial encounter: Secondary | ICD-10-CM | POA: Insufficient documentation

## 2014-08-02 DIAGNOSIS — S82451A Displaced comminuted fracture of shaft of right fibula, initial encounter for closed fracture: Secondary | ICD-10-CM | POA: Insufficient documentation

## 2014-08-02 DIAGNOSIS — Z720 Tobacco use: Secondary | ICD-10-CM | POA: Insufficient documentation

## 2014-08-02 DIAGNOSIS — W108XXA Fall (on) (from) other stairs and steps, initial encounter: Secondary | ICD-10-CM | POA: Insufficient documentation

## 2014-08-02 DIAGNOSIS — Y9389 Activity, other specified: Secondary | ICD-10-CM | POA: Insufficient documentation

## 2014-08-02 MED ORDER — OXYCODONE-ACETAMINOPHEN 5-325 MG PO TABS: 1.0000 | ORAL_TABLET | ORAL | Status: DC | PRN

## 2014-08-02 MED ORDER — OXYCODONE-ACETAMINOPHEN 5-325 MG PO TABS
1.0000 | ORAL_TABLET | Freq: Once | ORAL | Status: AC
  Administered 2014-08-02: 1 via ORAL
  Filled 2014-08-02: qty 1

## 2014-08-02 NOTE — ED Provider Notes (Signed)
CSN: [id number]     Arrival date & time 08/02/14  [id number] History  This chart was scribed for Gilda Creasehristopher J Jeena Arnett, MD by Elveria Risingimelie Horne, ED scribe.  This patient was seen in room [facility] and the patient's care was started at 9:37 AM.   Chief Complaint  Patient presents with  . Ankle Pain   The history is provided by the patient. No language interpreter was used.   HPI Comments: Evan Valdez is a 49 y.o. male who presents to the Emergency Department with right knee and ankle injuries resulting from a mechanical fall yesterday during which patient reports twisting both his ankle ankle and knee.. Patient denies striking his head or loss of consciousness. Patient locates pain to medial and inferior aspects of right knee and pain and swelling to right ankle. Patent reports exacerbated pain at ankle especially with dorsiflexion, which he explains causes radiation of pain into his right lower leg. Patient denies pain in foot.    History reviewed. No pertinent past medical history. Past Surgical History  Procedure Laterality Date  . Colonoscopy    . Bronchoscopy    . Orif ulnar fracture Right 08/10/2012    Procedure: OPEN REDUCTION INTERNAL FIXATION (ORIF) ULNAR FRACTURE;  Surgeon: Darreld McleanWayne Keeling, MD;  Location: [facility];  Service: Orthopedics;  Laterality: Right;   History reviewed. No pertinent family history. History  Substance Use Topics  . Smoking status: Current Every Day Smoker -- 1.00 packs/day for 20 years    Types: Cigarettes  . Smokeless tobacco: Not on file  . Alcohol Use: Yes     Comment: occasional     Review of Systems  Constitutional: Negative for fever and chills.  Musculoskeletal: Positive for arthralgias.  All other systems reviewed and are negative.     Allergies  Review of patient's allergies indicates no known allergies.  Home Medications   Prior to Admission medications   Medication Sig Start Date End Date Taking? Authorizing Provider  acetaminophen (TYLENOL)  500 MG tablet Take 1,000 mg by mouth every 6 (six) hours as needed for mild pain or moderate pain.   Yes Historical Provider, MD  diphenhydramine-acetaminophen (TYLENOL PM) 25-500 MG TABS Take 1 tablet by mouth at bedtime as needed (pain/sleep).   Yes Historical Provider, MD   Triage Vitals: BP 126/76 mmHg  Pulse 74  Temp(Src) 99.3 F (37.4 C)  Resp 18  Ht 5\' 11"  (1.803 m)  Wt 165 lb (74.844 kg)  BMI 23.02 kg/m2  SpO2 95% Physical Exam  Constitutional: He is oriented to person, place, and time. He appears well-developed and well-nourished. No distress.  HENT:  Head: Normocephalic and atraumatic.  Right Ear: Hearing normal.  Left Ear: Hearing normal.  Nose: Nose normal.  Mouth/Throat: Oropharynx is clear and moist and mucous membranes are normal.  Eyes: Conjunctivae and EOM are normal. Pupils are equal, round, and reactive to light.  Neck: Normal range of motion. Neck supple.  Cardiovascular: Regular rhythm, S1 normal and S2 normal.  Exam reveals no gallop and no friction rub.   No murmur heard. Pulmonary/Chest: Effort normal and breath sounds normal. No respiratory distress. He exhibits no tenderness.  Abdominal: Soft. Normal appearance and bowel sounds are normal. There is no hepatosplenomegaly. There is no tenderness. There is no rebound, no guarding, no tenderness at McBurney's point and negative Murphy's sign. No hernia.  Musculoskeletal: Normal range of motion.  Significant swelling of right ankle and foot diffusely.Tender to lateral malleolus with ecchymosis there. Abrasion to medial portion  of right knee with tenderness there.   Neurological: He is alert and oriented to person, place, and time. He has normal strength. No cranial nerve deficit or sensory deficit. Coordination normal. GCS eye subscore is 4. GCS verbal subscore is 5. GCS motor subscore is 6.  Skin: Skin is warm, dry and intact. No rash noted. No cyanosis.  Psychiatric: He has a normal mood and affect. His speech is  normal and behavior is normal. Thought content normal.  Nursing note and vitals reviewed.   ED Course  Procedures (including critical care time)  COORDINATION OF CARE: 9:43 AM- Discussed treatment plan with patient at bedside and patient agreed to plan.   Labs Review Labs Reviewed - No data to display  Imaging Review No results found.   EKG Interpretation None      MDM   Final diagnoses:  Fibula fracture, right, closed, initial encounter    Presents to ER for evaluation of ankle injury. Patient twisted his right ankle yesterday, has significant pain, swelling and ecchymosis around the lateral aspect of the ankle. X-ray confirms distal fibula fracture. Patient placed in short-leg splint, crutches. Follow-up with orthopedics.  I personally performed the services described in this documentation, which was scribed in my presence. The recorded information has been reviewed and is accurate.    Gilda Crease, MD 08/02/14 [phone]

## 2014-08-02 NOTE — Discharge Instructions (Signed)
Fibular Fracture, Ankle, Adult, Treated With or Without Immobilization °A fibular fracture at your ankle is a break (fracture) bone in the smallest of the two bones in your lower leg, located on the outside of your leg (fibula) close to the area at your ankle joint. °CAUSES °· Rolling your ankle. °· Twisting your ankle. °· Extreme flexing or extending of your foot. °· Severe force on your ankle as when falling from a distance. °RISK FACTORS °· Jumping activities. °· Participation in sports. °· Osteoporosis. °· Advanced age. °· Previous ankle injuries. °SIGNS AND SYMPTOMS °· Pain. °· Swelling. °· Inability to put weight on injured ankle. °· Bruising. °· Bone deformities at site of injury. °DIAGNOSIS  °This fracture is diagnosed with the help of an X-ray exam. °TREATMENT  °If the fractured bone did not move out of place it usually will heal without problems and does casting or splinting. If immobilization is needed for comfort or the fractured bone moved out of place and will not heal properly with immobilization, a cast or splint will be used. °HOME CARE INSTRUCTIONS  °· Apply ice to the area of injury: °¨ Put ice in a plastic bag. °¨ Place a towel between your skin and the bag. °¨ Leave the ice on for 20 minutes, 2-3 times a day. °· Use crutches as directed. Resume walking without crutches as directed by your health care provider. °· Only take over-the-counter or prescription medicines for pain, discomfort, or fever as directed by your health care provider. °· If you have a removable splint or boot, do not remove the boot unless directed by your health care provider. °SEEK MEDICAL CARE IF:  °· You have continued pain or more swelling °· The medications do not control the pain. °SEEK IMMEDIATE MEDICAL CARE IF: °· You develop severe pain in the leg or foot. °· Your skin or nails below the injury turn blue or grey or feel cold or numb. °MAKE SURE YOU:  °· Understand these instructions. °· Will watch your  condition. °· Will get help right away if you are not doing well or get worse. °Document Released: 01/03/2005 Document Revised: 10/24/2012 Document Reviewed: 08/15/2012 °[employer]® Patient Information ©2015 [employer]. This information is not intended to replace advice given to you by your health care provider. Make sure you discuss any questions you have with your health care provider. ° °

## 2014-08-02 NOTE — ED Notes (Signed)
Pt reports slipped and fell on wet steps yesterday. Pt denies hitting head or loc. Moderate swelling noted to right ankle/foot. No deformity noted. Cap refill >3 secs. dp pulse faint.

## 2014-08-05 ENCOUNTER — Encounter: Payer: Self-pay | Admitting: Orthopedic Surgery

## 2014-08-05 ENCOUNTER — Ambulatory Visit (INDEPENDENT_AMBULATORY_CARE_PROVIDER_SITE_OTHER): Payer: Self-pay | Admitting: Orthopedic Surgery

## 2014-08-05 VITALS — BP 113/78 | Ht 71.0 in | Wt 165.0 lb

## 2014-08-05 DIAGNOSIS — S82401A Unspecified fracture of shaft of right fibula, initial encounter for closed fracture: Secondary | ICD-10-CM

## 2014-08-05 MED ORDER — OXYCODONE-ACETAMINOPHEN 5-325 MG PO TABS: 1.0000 | ORAL_TABLET | ORAL | Status: DC | PRN

## 2014-08-05 NOTE — Patient Instructions (Signed)
DO NOT GET CAST WET   NO WEIGHT BEARING ON CAST

## 2014-08-05 NOTE — Progress Notes (Signed)
Patient ID: Evan JunglingMark A Okerlund, male   DOB: 1965/09/02, 49 y.o.   MRN: [id number]   Chief Complaint  Patient presents with  . Leg Injury    er follow up, right distal fibula fracture, DOI 08/01/14    Evan JunglingMark A Kintz is a 49 y.o. male.   HPI This 49 year old male with a history of a previous right calcaneus fracture fell off the steps at his home and presents with a four-day history of pain over the right fibula associated with swelling loss of motion and ecchymosis of the skin  He denies any problems with his bowel or bladder function no fever no chills  He said he actually injured it continued with his cook out and then noticed some swelling later on. He went to the ER in the x-ray show a comminuted fracture of the fibular shaft in the distal third but no displacement of the ankle mortise   Review of Systems See hpi  History reviewed. No pertinent past medical history.  No Known Allergies  Current Outpatient Prescriptions  Medication Sig Dispense Refill  . acetaminophen (TYLENOL) 500 MG tablet Take 1,000 mg by mouth every 6 (six) hours as needed for mild pain or moderate pain.    . diphenhydramine-acetaminophen (TYLENOL PM) 25-500 MG TABS Take 1 tablet by mouth at bedtime as needed (pain/sleep).    Marland Kitchen. oxyCODONE-acetaminophen (PERCOCET) 5-325 MG per tablet Take 1 tablet by mouth every 4 (four) hours as needed. 84 tablet 0   No current facility-administered medications for this visit.     Physical Exam Blood pressure 113/78, height 5\' 11"  (1.803 m), weight 165 lb (74.844 kg). Physical Exam  The patient is well developed well nourished and well groomed.   Orientation to person place and time is normal   Mood is pleasant.  Ambulatory status crutches no weightbearing posterior splint which was removed  Inspection: Tenderness over the distal fibula the calcaneus is nontender  ROM: Limited ankle ROM 10  Stability: Not tested but x-rays show no instability pattern  Motor exam: Normal  muscle tone  Skin: Ecchymosis at the fracture site extends to the tips of the toes  Neuro: Soft touch normal  Vascular: Dorsalis pedis 2+  Lymph: Not involved   Data Reviewed Order xrays no Read xrays  the hospital film show a comminuted fracture of the distal third of the fibula with ankle mortise intact there is an old calcaneus fracture Reports reviewed: ER records no additional information  Diagnosis New with further work up no New without further work up yes  Encounter Diagnosis  Name Primary?  . Fracture, fibula, shaft, right, closed, initial encounter Yes       Management Short leg cast application right leg  Follow-up 4 weeks x-ray out of plaster then applied new cast expect 8 weeks of fracture treatment  Refill Percocet No. 84  Decrease Percocet to Tylenol with hydrocodone next visit

## 2014-09-02 ENCOUNTER — Encounter: Payer: Self-pay | Admitting: Orthopedic Surgery

## 2014-09-02 ENCOUNTER — Ambulatory Visit (INDEPENDENT_AMBULATORY_CARE_PROVIDER_SITE_OTHER): Payer: Self-pay

## 2014-09-02 ENCOUNTER — Ambulatory Visit (INDEPENDENT_AMBULATORY_CARE_PROVIDER_SITE_OTHER): Payer: Self-pay | Admitting: Orthopedic Surgery

## 2014-09-02 VITALS — BP 86/63 | Ht 71.0 in | Wt 165.0 lb

## 2014-09-02 DIAGNOSIS — S82402D Unspecified fracture of shaft of left fibula, subsequent encounter for closed fracture with routine healing: Secondary | ICD-10-CM

## 2014-09-02 DIAGNOSIS — S82891A Other fracture of right lower leg, initial encounter for closed fracture: Secondary | ICD-10-CM
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
## 2014-09-30 ENCOUNTER — Ambulatory Visit: Payer: Self-pay

## 2014-09-30 ENCOUNTER — Ambulatory Visit (INDEPENDENT_AMBULATORY_CARE_PROVIDER_SITE_OTHER): Payer: Self-pay | Admitting: Orthopedic Surgery

## 2014-09-30 ENCOUNTER — Encounter: Payer: Self-pay | Admitting: Orthopedic Surgery

## 2014-09-30 DIAGNOSIS — S82891A Other fracture of right lower leg, initial encounter for closed fracture: Secondary | ICD-10-CM

## 2014-10-01 NOTE — Progress Notes (Signed)
No show

## 2014-12-10 ENCOUNTER — Emergency Department (HOSPITAL_COMMUNITY)
Admission: EM | Admit: 2014-12-10 | Discharge: 2014-12-10 | Disposition: A | Discharge status: Home / Self Care | Payer: Worker's Compensation | Attending: Emergency Medicine | Admitting: Emergency Medicine

## 2014-12-10 ENCOUNTER — Emergency Department (HOSPITAL_COMMUNITY): Payer: Worker's Compensation

## 2014-12-10 ENCOUNTER — Encounter (HOSPITAL_COMMUNITY): Payer: Self-pay | Admitting: Emergency Medicine

## 2014-12-10 DIAGNOSIS — Y9389 Activity, other specified: Secondary | ICD-10-CM | POA: Insufficient documentation

## 2014-12-10 DIAGNOSIS — Y990 Civilian activity done for income or pay: Secondary | ICD-10-CM | POA: Insufficient documentation

## 2014-12-10 DIAGNOSIS — Z8781 Personal history of (healed) traumatic fracture: Secondary | ICD-10-CM | POA: Insufficient documentation

## 2014-12-10 DIAGNOSIS — S0181XA Laceration without foreign body of other part of head, initial encounter: Secondary | ICD-10-CM | POA: Insufficient documentation

## 2014-12-10 DIAGNOSIS — W208XXA Other cause of strike by thrown, projected or falling object, initial encounter: Secondary | ICD-10-CM | POA: Insufficient documentation

## 2014-12-10 DIAGNOSIS — S0990XA Unspecified injury of head, initial encounter: Secondary | ICD-10-CM

## 2014-12-10 DIAGNOSIS — Y9289 Other specified places as the place of occurrence of the external cause: Secondary | ICD-10-CM | POA: Insufficient documentation

## 2014-12-10 DIAGNOSIS — F17210 Nicotine dependence, cigarettes, uncomplicated: Secondary | ICD-10-CM | POA: Insufficient documentation

## 2014-12-10 DIAGNOSIS — Z23 Encounter for immunization: Secondary | ICD-10-CM | POA: Insufficient documentation

## 2014-12-10 HISTORY — DX: Unspecified fracture of shaft of unspecified tibia, initial encounter for closed fracture: S82.209A

## 2014-12-10 HISTORY — DX: Unspecified fracture of shaft of unspecified fibula, initial encounter for closed fracture: S82.409A

## 2014-12-10 MED ORDER — OXYCODONE-ACETAMINOPHEN 5-325 MG PO TABS
1.0000 | ORAL_TABLET | Freq: Once | ORAL | Status: DC
  Filled 2014-12-10: qty 1

## 2014-12-10 MED ORDER — TETANUS-DIPHTH-ACELL PERTUSSIS 5-2.5-18.5 LF-MCG/0.5 IM SUSP
0.5000 mL | Freq: Once | INTRAMUSCULAR | Status: AC
  Administered 2014-12-10: 0.5 mL via INTRAMUSCULAR
  Filled 2014-12-10: qty 0.5

## 2014-12-10 MED ORDER — MORPHINE SULFATE (PF) 4 MG/ML IV SOLN
6.0000 mg | Freq: Once | INTRAVENOUS | Status: AC
  Administered 2014-12-10: 6 mg via INTRAVENOUS
  Filled 2014-12-10: qty 2

## 2014-12-10 MED ORDER — CEPHALEXIN 500 MG PO CAPS: 500.0000 mg | ORAL_CAPSULE | Freq: Three times a day (TID) | ORAL | Status: AC

## 2014-12-10 MED ORDER — LIDOCAINE-EPINEPHRINE (PF) 2 %-1:200000 IJ SOLN
20.0000 mL | Freq: Once | INTRAMUSCULAR | Status: AC
  Administered 2014-12-10: 20 mL
  Filled 2014-12-10: qty 20
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
## 2014-12-10 NOTE — Discharge Instructions (Signed)
Facial Laceration ° A facial laceration is a cut on the face. These injuries can be painful and cause bleeding. Lacerations usually heal quickly, but they need special care to reduce scarring. °DIAGNOSIS  °Your health care provider will take a medical history, ask for details about how the injury occurred, and examine the wound to determine how deep the cut is. °TREATMENT  °Some facial lacerations may not require closure. Others may not be able to be closed because of an increased risk of infection. The risk of infection and the chance for successful closure will depend on various factors, including the amount of time since the injury occurred. °The wound may be cleaned to help prevent infection. If closure is appropriate, pain medicines may be given if needed. Your health care provider will use stitches (sutures), wound glue (adhesive), or skin adhesive strips to repair the laceration. These tools bring the skin edges together to allow for faster healing and a better cosmetic outcome. If needed, you may also be given a tetanus shot. °HOME CARE INSTRUCTIONS °· Only take over-the-counter or prescription medicines as directed by your health care provider. °· Follow your health care provider's instructions for wound care. These instructions will vary depending on the technique used for closing the wound. °For Sutures: °· Keep the wound clean and dry.   °· If you were given a bandage (dressing), you should change it at least once a day. Also change the dressing if it becomes wet or dirty, or as directed by your health care provider.   °· Wash the wound with soap and water 2 times a day. Rinse the wound off with water to remove all soap. Pat the wound dry with a clean towel.   °· After cleaning, apply a thin layer of the antibiotic ointment recommended by your health care provider. This will help prevent infection and keep the dressing from sticking.   °· You may shower as usual after the first 24 hours. Do not soak the  wound in water until the sutures are removed.   °· Get your sutures removed as directed by your health care provider. With facial lacerations, sutures should usually be taken out after 4-5 days to avoid stitch marks.   °· Wait a few days after your sutures are removed before applying any makeup. °For Skin Adhesive Strips: °· Keep the wound clean and dry.   °· Do not get the skin adhesive strips wet. You may bathe carefully, using caution to keep the wound dry.   °· If the wound gets wet, pat it dry with a clean towel.   °· Skin adhesive strips will fall off on their own. You may trim the strips as the wound heals. Do not remove skin adhesive strips that are still stuck to the wound. They will fall off in time.   °For Wound Adhesive: °· You may briefly wet your wound in the shower or bath. Do not soak or scrub the wound. Do not swim. Avoid periods of heavy sweating until the skin adhesive has fallen off on its own. After showering or bathing, gently pat the wound dry with a clean towel.   °· Do not apply liquid medicine, cream medicine, ointment medicine, or makeup to your wound while the skin adhesive is in place. This may loosen the film before your wound is healed.   °· If a dressing is placed over the wound, be careful not to apply tape directly over the skin adhesive. This may cause the adhesive to be pulled off before the wound is healed.   °· Avoid   prolonged exposure to sunlight or tanning lamps while the skin adhesive is in place. °· The skin adhesive will usually remain in place for 5-10 days, then naturally fall off the skin. Do not pick at the adhesive film.   °After Healing: °Once the wound has healed, cover the wound with sunscreen during the day for 1 full year. This can help minimize scarring. Exposure to ultraviolet light in the first year will darken the scar. It can take 1-2 years for the scar to lose its redness and to heal completely.  °SEEK MEDICAL CARE IF: °· You have a fever. °SEEK IMMEDIATE  MEDICAL CARE IF: °· You have redness, pain, or swelling around the wound.   °· You see a yellowish-white fluid (pus) coming from the wound.   °  °This information is not intended to replace advice given to you by your health care provider. Make sure you discuss any questions you have with your health care provider. °  °Document Released: 02/11/2004 Document Revised: 01/24/2014 Document Reviewed: 08/16/2012 °[employer]vier Interactive Patient Education ©2016 [employer] ° °

## 2014-12-10 NOTE — ED Provider Notes (Signed)
CSN: [id number]     Arrival date & time 12/10/14  0906 History  By signing my name below, I, Tanda Rockers, attest that this documentation has been prepared under the direction and in the presence of Azalia Bilis, MD. Electronically Signed: Tanda Rockers, ED Scribe. 12/10/2014. 10:41 AM.  Chief Complaint  Patient presents with  . Loss of Consciousness   The history is provided by the patient. No language interpreter was used.     HPI Comments: Evan Valdez is a 49 y.o. male who presents to the Emergency Department complaining of sudden onset head injury that occurred PTA. Pt was cutting down trees when a limb fell and hit pt in the head. He was wearing a metal safety helmet but notes LOC for a few seconds. Pt also presents with a right eyebrow laceration that he reports is from the metal helmet. He notes sudden onset, constant pain to the area as well. Bleeding is controlled. Denies neck pain or any other associated symptoms. Pt is not on any anticoagulants. Tetanus status unknown.    Past Medical History  Diagnosis Date  . Tibia/fibula fracture    Past Surgical History  Procedure Laterality Date  . Colonoscopy    . Bronchoscopy    . Orif ulnar fracture Right 08/10/2012    Procedure: OPEN REDUCTION INTERNAL FIXATION (ORIF) ULNAR FRACTURE;  Surgeon: Darreld Mclean, MD;  Location: [facility];  Service: Orthopedics;  Laterality: Right;   History reviewed. No pertinent family history. Social History  Substance Use Topics  . Smoking status: Current Every Day Smoker -- 1.00 packs/day for 20 years    Types: Cigarettes  . Smokeless tobacco: None  . Alcohol Use: Yes     Comment: occasional     Review of Systems  A complete 10 system review of systems was obtained and all systems are negative except as noted in the HPI and PMH.    Allergies  Review of patient's allergies indicates no known allergies.  Home Medications   Prior to Admission medications   Medication Sig Start Date End  Date Taking? Authorizing Provider  acetaminophen (TYLENOL) 500 MG tablet Take 1,000 mg by mouth every 6 (six) hours as needed for mild pain or moderate pain.   Yes Historical Provider, MD  acetaminophen-codeine (TYLENOL #3) 300-30 MG tablet Take 1 tablet by mouth daily as needed for moderate pain.   Yes Historical Provider, MD  diphenhydramine-acetaminophen (TYLENOL PM) 25-500 MG TABS Take 1-2 tablets by mouth at bedtime as needed (pain/sleep).    Yes Historical Provider, MD  naproxen sodium (ANAPROX) 220 MG tablet Take 220 mg by mouth daily as needed (pain).   Yes Historical Provider, MD   BP 127/78 mmHg  Pulse 62  Temp(Src) 97.9 F (36.6 C) (Oral)  Resp 23  Ht  (1.778 m)  Wt 170 lb (77.111 kg)  BMI 24.39 kg/m2  SpO2 97%   Physical Exam  Constitutional: He is oriented to person, place, and time. He appears well-developed and well-nourished.  HENT:  Head: Normocephalic.  5 cm right lateral forehead laceration without active bleeding  Eyes: EOM are normal.  Small amount of periorbital swelling on the right  Neck: Normal range of motion.  Cardiovascular: Normal rate, regular rhythm, normal heart sounds and intact distal pulses.   Pulmonary/Chest: Effort normal and breath sounds normal. No respiratory distress.  Abdominal: Soft. He exhibits no distension. There is no tenderness.  Musculoskeletal: Normal range of motion.  Neurological: He is alert and oriented to  person, place, and time.  Skin: Skin is warm and dry.  Psychiatric: He has a normal mood and affect. Judgment normal.  Nursing note and vitals reviewed.   ED Course  Procedures (including critical care time)  LAYERED LACERATION REPAIR PROCEDURE NOTE The patient's identification was confirmed and consent was obtained. This procedure was performed by Azalia BilisKevin Henrick Mcgue, MD at 10:16 AM. Site: Right lateral forehead Sterile procedures observed Anesthetic used (type and amt): 2% Lidocaine with epinephrine, 10 CC's  Suture  type/size: 4-0 Vicryl and 5-0 Prolene Length: 5 cm # of Sutures: 3 deep 4-0 Vicryl sutures; 12 running interlocked 5-0 Prolene sutures  Technique: Layered closure  Complexity: moderate Antibx ointment applied: no  Tetanus UTD or ordered: ordered Site anesthetized, irrigated with NS, explored without evidence of foreign body, wound well approximated, site covered with dry, sterile dressing.  Patient tolerated procedure well without complications. Instructions for care discussed verbally and patient provided with additional written instructions for homecare and f/u.   DIAGNOSTIC STUDIES: Oxygen Saturation is 97% on RA, normal by my interpretation.    COORDINATION OF CARE: 10:16 AM-Discussed treatment plan which includes suture placement and Rx Keflex with pt at bedside and pt agreed to plan.   Labs Review Labs Reviewed - No data to display  Imaging Review Ct Head Wo Contrast  12/10/2014  CLINICAL DATA:  Tree fell on head this morning. Large laceration over left eye. Loss of consciousness for several seconds. Dizziness. EXAM: CT HEAD WITHOUT CONTRAST CT CERVICAL SPINE WITHOUT CONTRAST TECHNIQUE: Multidetector CT imaging of the head and cervical spine was performed following the standard protocol without intravenous contrast. Multiplanar CT image reconstructions of the cervical spine were also generated. COMPARISON:  CT head 08/09/2012 and CT cervical spine 06/16/2009. FINDINGS: CT HEAD FINDINGS No evidence of an acute infarct, acute hemorrhage, mass lesion, mass effect or hydrocephalus. Visualized portions of the paranasal sinuses and mastoid air cells are clear. There is an open soft tissue wound overlying the right frontal sinus. No fracture. CT CERVICAL SPINE FINDINGS Alignment is anatomic. Vertebral body height is maintained. No fracture. Mild uncovertebral and facet hypertrophy. At C3-4, minimal left neural foraminal narrowing. At C4-5, mild bilateral neural foraminal narrowing. At C5-6,  moderate bilateral neural foraminal narrowing. At C6-7, mild to moderate left neural foraminal narrowing. Visualized lung apices are clear. Soft tissues are otherwise unremarkable. IMPRESSION: 1. No acute intracranial abnormality. No evidence of acute trauma in the cervical spine. 2. Open soft tissue wound along the right frontal scalp. 3. Mild uncovertebral and facet hypertrophy. Electronically Signed   By: Leanna BattlesMelinda  Blietz M.D.   On: 12/10/2014 10:10   Ct Cervical Spine Wo Contrast  12/10/2014  CLINICAL DATA:  Tree fell on head this morning. Large laceration over left eye. Loss of consciousness for several seconds. Dizziness. EXAM: CT HEAD WITHOUT CONTRAST CT CERVICAL SPINE WITHOUT CONTRAST TECHNIQUE: Multidetector CT imaging of the head and cervical spine was performed following the standard protocol without intravenous contrast. Multiplanar CT image reconstructions of the cervical spine were also generated. COMPARISON:  CT head 08/09/2012 and CT cervical spine 06/16/2009. FINDINGS: CT HEAD FINDINGS No evidence of an acute infarct, acute hemorrhage, mass lesion, mass effect or hydrocephalus. Visualized portions of the paranasal sinuses and mastoid air cells are clear. There is an open soft tissue wound overlying the right frontal sinus. No fracture. CT CERVICAL SPINE FINDINGS Alignment is anatomic. Vertebral body height is maintained. No fracture. Mild uncovertebral and facet hypertrophy. At C3-4, minimal left neural foraminal narrowing.  At C4-5, mild bilateral neural foraminal narrowing. At C5-6, moderate bilateral neural foraminal narrowing. At C6-7, mild to moderate left neural foraminal narrowing. Visualized lung apices are clear. Soft tissues are otherwise unremarkable. IMPRESSION: 1. No acute intracranial abnormality. No evidence of acute trauma in the cervical spine. 2. Open soft tissue wound along the right frontal scalp. 3. Mild uncovertebral and facet hypertrophy. Electronically Signed   By: Leanna Battles M.D.   On: 12/10/2014 10:10     I have personally reviewed and evaluated these images as part of my medical decision-making.   EKG Interpretation None      MDM   Final diagnoses:  Facial laceration, initial encounter  Closed head injury, initial encounter    CT head and cervical spine without acute traumatic injury.  Laceration repaired.  Infection warnings given.  Wash out extensively at the bedside.  No indication for additional treatment in the emergency department.  Laceration instructions given.  Close head injury warnings given   I personally performed the services described in this documentation, which was scribed in my presence. The recorded information has been reviewed and is accurate.       Azalia Bilis, MD 12/10/14 1054

## 2014-12-10 NOTE — ED Notes (Signed)
Pt taken to CT.

## 2014-12-10 NOTE — ED Notes (Signed)
Pt made aware to return if symptoms worsen or if any life threatening symptoms occur.   

## 2014-12-10 NOTE — ED Notes (Signed)
Pt taken to Lab for workers comp.

## 2014-12-10 NOTE — ED Notes (Signed)
Pt returned from CT °

## 2015-05-30 ENCOUNTER — Emergency Department (HOSPITAL_COMMUNITY): Payer: Self-pay

## 2015-05-30 ENCOUNTER — Emergency Department (HOSPITAL_COMMUNITY)
Admission: EM | Admit: 2015-05-30 | Discharge: 2015-05-30 | Disposition: A | Discharge status: Home / Self Care | Payer: Self-pay | Attending: Emergency Medicine | Admitting: Emergency Medicine

## 2015-05-30 ENCOUNTER — Encounter (HOSPITAL_COMMUNITY): Payer: Self-pay | Admitting: Cardiology

## 2015-05-30 DIAGNOSIS — Y999 Unspecified external cause status: Secondary | ICD-10-CM | POA: Insufficient documentation

## 2015-05-30 DIAGNOSIS — R109 Unspecified abdominal pain: Secondary | ICD-10-CM

## 2015-05-30 DIAGNOSIS — Y939 Activity, unspecified: Secondary | ICD-10-CM | POA: Insufficient documentation

## 2015-05-30 DIAGNOSIS — W01111A Fall on same level from slipping, tripping and stumbling with subsequent striking against power tool or machine, initial encounter: Secondary | ICD-10-CM | POA: Insufficient documentation

## 2015-05-30 DIAGNOSIS — S301XXA Contusion of abdominal wall, initial encounter: Secondary | ICD-10-CM | POA: Insufficient documentation

## 2015-05-30 DIAGNOSIS — F17210 Nicotine dependence, cigarettes, uncomplicated: Secondary | ICD-10-CM | POA: Insufficient documentation

## 2015-05-30 DIAGNOSIS — Z79899 Other long term (current) drug therapy: Secondary | ICD-10-CM | POA: Insufficient documentation

## 2015-05-30 DIAGNOSIS — R52 Pain, unspecified: Secondary | ICD-10-CM

## 2015-05-30 DIAGNOSIS — N201 Calculus of ureter: Secondary | ICD-10-CM | POA: Insufficient documentation

## 2015-05-30 DIAGNOSIS — Y929 Unspecified place or not applicable: Secondary | ICD-10-CM | POA: Insufficient documentation

## 2015-05-30 LAB — COMPREHENSIVE METABOLIC PANEL
ALBUMIN: 4.4 g/dL (ref 3.5–5.0)
ALT: 30 U/L (ref 17–63)
AST: 29 U/L (ref 15–41)
Alkaline Phosphatase: 66 U/L (ref 38–126)
Anion gap: 7 (ref 5–15)
BUN: 10 mg/dL (ref 6–20)
CHLORIDE: 99 mmol/L — AB (ref 101–111)
CO2: 27 mmol/L (ref 22–32)
Calcium: 8.8 mg/dL — ABNORMAL LOW (ref 8.9–10.3)
Creatinine, Ser: 0.79 mg/dL (ref 0.61–1.24)
GFR calc Af Amer: >60 mL/min (ref >60)
GLUCOSE: 112 mg/dL — AB (ref 65–99)
POTASSIUM: 3.1 mmol/L — AB (ref 3.5–5.1)
Sodium: 133 mmol/L — ABNORMAL LOW (ref 135–145)
Total Bilirubin: 1 mg/dL (ref 0.3–1.2)
Total Protein: 7.2 g/dL (ref 6.5–8.1)

## 2015-05-30 LAB — CBC WITH DIFFERENTIAL/PLATELET
BASOS ABS: 0 10*3/uL (ref 0.0–0.1)
BASOS PCT: 0 %
EOS PCT: 1 %
Eosinophils Absolute: 0.1 10*3/uL (ref 0.0–0.7)
HCT: 44.3 % (ref 39.0–52.0)
Hemoglobin: 15.3 g/dL (ref 13.0–17.0)
Lymphocytes Relative: 18 %
Lymphs Abs: 1.9 10*3/uL (ref 0.7–4.0)
MCH: 31.5 pg (ref 26.0–34.0)
MCHC: 34.5 g/dL (ref 30.0–36.0)
MCV: 91.3 fL (ref 78.0–100.0)
MONO ABS: 0.8 10*3/uL (ref 0.1–1.0)
Monocytes Relative: 8 %
Neutro Abs: 7.4 10*3/uL (ref 1.7–7.7)
Neutrophils Relative %: 73 %
PLATELETS: 282 10*3/uL (ref 150–400)
RBC: 4.85 MIL/uL (ref 4.22–5.81)
RDW: 12.3 % (ref 11.5–15.5)
WBC: 10.1 10*3/uL (ref 4.0–10.5)

## 2015-05-30 LAB — URINALYSIS, ROUTINE W REFLEX MICROSCOPIC
Bilirubin Urine: NEGATIVE
GLUCOSE, UA: NEGATIVE mg/dL
KETONES UR: NEGATIVE mg/dL
LEUKOCYTES UA: NEGATIVE
Nitrite: NEGATIVE
PROTEIN: 100 mg/dL — AB
Specific Gravity, Urine: 1.02 (ref 1.005–1.030)
pH: 6.5 (ref 5.0–8.0)

## 2015-05-30 LAB — URINE MICROSCOPIC-ADD ON
SQUAMOUS EPITHELIAL / LPF: NONE SEEN
WBC UA: NONE SEEN WBC/hpf (ref 0–5)

## 2015-05-30 LAB — SAMPLE TO BLOOD BANK

## 2015-05-30 MED ORDER — MORPHINE SULFATE (PF) 4 MG/ML IV SOLN
4.0000 mg | Freq: Once | INTRAVENOUS | Status: AC
  Administered 2015-05-30: 4 mg via INTRAVENOUS

## 2015-05-30 MED ORDER — OXYCODONE-ACETAMINOPHEN 5-325 MG PO TABS: 1.0000 | ORAL_TABLET | ORAL | Status: DC | PRN

## 2015-05-30 MED ORDER — HYDROMORPHONE HCL 1 MG/ML IJ SOLN
INTRAMUSCULAR | Status: AC
  Filled 2015-05-30: qty 1

## 2015-05-30 MED ORDER — HYDROMORPHONE HCL 1 MG/ML IJ SOLN
1.0000 mg | Freq: Once | INTRAMUSCULAR | Status: AC
  Administered 2015-05-30: 1 mg via INTRAVENOUS

## 2015-05-30 MED ORDER — ONDANSETRON 8 MG PO TBDP: 8.0000 mg | ORAL_TABLET | Freq: Three times a day (TID) | ORAL | Status: AC | PRN

## 2015-05-30 MED ORDER — KETOROLAC TROMETHAMINE 30 MG/ML IJ SOLN
30.0000 mg | Freq: Once | INTRAMUSCULAR | Status: AC
  Administered 2015-05-30: 30 mg via INTRAVENOUS

## 2015-05-30 MED ORDER — ONDANSETRON HCL 4 MG/2ML IJ SOLN
INTRAMUSCULAR | Status: AC
  Filled 2015-05-30: qty 2

## 2015-05-30 MED ORDER — KETOROLAC TROMETHAMINE 30 MG/ML IJ SOLN
INTRAMUSCULAR | Status: AC
  Filled 2015-05-30: qty 1

## 2015-05-30 MED ORDER — MORPHINE SULFATE (PF) 4 MG/ML IV SOLN
INTRAVENOUS | Status: AC
  Filled 2015-05-30: qty 1

## 2015-05-30 MED ORDER — ONDANSETRON HCL 4 MG/2ML IJ SOLN
4.0000 mg | Freq: Once | INTRAMUSCULAR | Status: AC
  Administered 2015-05-30: 4 mg via INTRAVENOUS

## 2015-05-30 NOTE — Discharge Instructions (Signed)
Kidney Stones °Kidney stones (urolithiasis) are deposits that form inside your kidneys. The intense pain is caused by the stone moving through the urinary tract. When the stone moves, the ureter goes into spasm around the stone. The stone is usually passed in the urine.  °CAUSES  °· A disorder that makes certain neck glands produce too much parathyroid hormone (primary hyperparathyroidism). °· A buildup of uric acid crystals, similar to gout in your joints. °· Narrowing (stricture) of the ureter. °· A kidney obstruction present at birth (congenital obstruction). °· Previous surgery on the kidney or ureters. °· Numerous kidney infections. °SYMPTOMS  °· Feeling sick to your stomach (nauseous). °· Throwing up (vomiting). °· Blood in the urine (hematuria). °· Pain that usually spreads (radiates) to the groin. °· Frequency or urgency of urination. °DIAGNOSIS  °· Taking a history and physical exam. °· Blood or urine tests. °· CT scan. °· Occasionally, an examination of the inside of the urinary bladder (cystoscopy) is performed. °TREATMENT  °· Observation. °· Increasing your fluid intake. °· Extracorporeal shock wave lithotripsy--This is a noninvasive procedure that uses shock waves to break up kidney stones. °· Surgery may be needed if you have severe pain or persistent obstruction. There are various surgical procedures. Most of the procedures are performed with the use of small instruments. Only small incisions are needed to accommodate these instruments, so recovery time is minimized. °The size, location, and chemical composition are all important variables that will determine the proper choice of action for you. Talk to your health care provider to better understand your situation so that you will minimize the risk of injury to yourself and your kidney.  °HOME CARE INSTRUCTIONS  °· Drink enough water and fluids to keep your urine clear or pale yellow. This will help you to pass the stone or stone fragments. °· Strain  all urine through the provided strainer. Keep all particulate matter and stones for your health care provider to see. The stone causing the pain may be as small as a grain of salt. It is very important to use the strainer each and every time you pass your urine. The collection of your stone will allow your health care provider to analyze it and verify that a stone has actually passed. The stone analysis will often identify what you can do to reduce the incidence of recurrences. °· Only take over-the-counter or prescription medicines for pain, discomfort, or fever as directed by your health care provider. °· Keep all follow-up visits as told by your health care provider. This is important. °· Get follow-up X-rays if required. The absence of pain does not always mean that the stone has passed. It may have only stopped moving. If the urine remains completely obstructed, it can cause loss of kidney function or even complete destruction of the kidney. It is your responsibility to make sure X-rays and follow-ups are completed. Ultrasounds of the kidney can show blockages and the status of the kidney. Ultrasounds are not associated with any radiation and can be performed easily in a matter of minutes. °· Make changes to your daily diet as told by your health care provider. You may be told to: °¨ Limit the amount of salt that you eat. °¨ Eat 5 or more servings of fruits and vegetables each day. °¨ Limit the amount of meat, poultry, fish, and eggs that you eat. °· Collect a 24-hour urine sample as told by your health care provider. You may need to collect another urine sample every 6-12   months. SEEK MEDICAL CARE IF:  You experience pain that is progressive and unresponsive to any pain medicine you have been prescribed. SEEK IMMEDIATE MEDICAL CARE IF:   Pain cannot be controlled with the prescribed medicine.  You have a fever or shaking chills.  The severity or intensity of pain increases over 18 hours and is not  relieved by pain medicine.  You develop a new onset of abdominal pain.  You feel faint or pass out.  You are unable to urinate.   This information is not intended to replace advice given to you by your health care provider. Make sure you discuss any questions you have with your health care provider.   Document Released: 01/03/2005 Document Revised: 09/24/2014 Document Reviewed: 06/06/2012 Elsevier Interactive Patient Education 2016 [employer]   You may take the oxycodone prescribed for pain relief.  This will make you drowsy - do not drive within 4 hours of taking this medication.

## 2015-05-30 NOTE — ED Provider Notes (Signed)
CSN: [id number]     Arrival date & time 05/30/15  0849 History   First MD Initiated Contact with Patient 05/30/15 [phone]     Chief Complaint  Patient presents with  . Flank Pain     (Consider location/radiation/quality/duration/timing/severity/associated sxs/prior Treatment) The history is provided by the patient.   Evan Valdez is a 50 y.o. male with complaints of right flank pain since slipping 4 days ago when getting into his truck and hitting his right side against the truck latch.  He reports bruising and abrasion at the site which has been sore but tolerable.  This morning he woke at 7 AM with severe pain in this location in addition to abdominal pain and pain in his right testicle.  He denies dysuria or hematuria, but urine has been dark for the past several days.  He also endorse nausea without emesis, denies fevers or chills.  No personal history of kidney stones.  He states that on the transport in his right scrotum started to swell.  He has taken 2 Tylenol this morning without improvement in symptoms.     Past Medical History  Diagnosis Date  . Tibia/fibula fracture    Past Surgical History  Procedure Laterality Date  . Colonoscopy    . Bronchoscopy    . Orif ulnar fracture Right 08/10/2012    Procedure: OPEN REDUCTION INTERNAL FIXATION (ORIF) ULNAR FRACTURE;  Surgeon: Darreld Mclean, MD;  Location: [facility];  Service: Orthopedics;  Laterality: Right;   History reviewed. No pertinent family history. Social History  Substance Use Topics  . Smoking status: Current Every Day Smoker -- 1.00 packs/day for 20 years    Types: Cigarettes  . Smokeless tobacco: None  . Alcohol Use: Yes     Comment: occasional     Review of Systems  Constitutional: Negative for fever.  HENT: Negative for congestion and sore throat.   Eyes: Negative.   Respiratory: Negative for chest tightness and shortness of breath.   Cardiovascular: Negative for chest pain.  Gastrointestinal: Negative for  nausea and abdominal pain.       Flank pain  Genitourinary: Positive for scrotal swelling and testicular pain. Negative for dysuria, hematuria and difficulty urinating.  Musculoskeletal: Negative for joint swelling, arthralgias and neck pain.  Skin: Negative.  Negative for rash and wound.  Neurological: Negative for dizziness, weakness, light-headedness, numbness and headaches.  Psychiatric/Behavioral: Negative.       Allergies  Review of patient's allergies indicates no known allergies.  Home Medications   Prior to Admission medications   Medication Sig Start Date End Date Taking? Authorizing Provider  acetaminophen (TYLENOL) 500 MG tablet Take 1,000 mg by mouth every 6 (six) hours as needed for mild pain or moderate pain.   Yes Historical Provider, MD  cephALEXin (KEFLEX) 500 MG capsule Take 1 capsule (500 mg total) by mouth 3 (three) times daily. Patient not taking: Reported on 05/30/2015 12/10/14   Azalia Bilis, MD  diphenhydramine-acetaminophen (TYLENOL PM) 25-500 MG TABS Take 1-2 tablets by mouth at bedtime as needed (pain/sleep).     Historical Provider, MD  naproxen sodium (ANAPROX) 220 MG tablet Take 220 mg by mouth daily as needed (pain).    Historical Provider, MD  ondansetron (ZOFRAN ODT) 8 MG disintegrating tablet Take 1 tablet (8 mg total) by mouth every 8 (eight) hours as needed for nausea or vomiting. 05/30/15   Burgess Amor, PA-C  oxyCODONE-acetaminophen (PERCOCET/ROXICET) 5-325 MG tablet Take 1 tablet by mouth every 4 (four) hours as  needed. 05/30/15   Burgess Amor, PA-C   BP 122/75 mmHg  Pulse 50  Temp(Src) 98 F (36.7 C) (Oral)  Resp 18  Ht 5\' 10"  (1.778 m)  Wt 69.854 kg  BMI 22.10 kg/m2  SpO2 95% Physical Exam  Constitutional: He appears well-developed and well-nourished.  HENT:  Head: Normocephalic and atraumatic.  Eyes: Conjunctivae are normal.  Neck: Normal range of motion.  Cardiovascular: Normal rate, regular rhythm, normal heart sounds and intact distal  pulses.   Pulmonary/Chest: Effort normal and breath sounds normal. He has no wheezes.  Abdominal: Soft. Bowel sounds are normal. There is generalized tenderness. There is CVA tenderness. There is no rigidity and no guarding.  Right CVA tenderness with significant bruising and abrasion at right flank.  Genitourinary: Penis normal. Right testis shows tenderness. Right testis shows no mass and no swelling.  No appreciable scrotal swelling.  Chaperone was present during exam.   Musculoskeletal: Normal range of motion.  Neurological: He is alert.  Skin: Skin is warm and dry.  Psychiatric: He has a normal mood and affect.  Nursing note and vitals reviewed.   ED Course  Procedures (including critical care time) Labs Review Labs Reviewed  URINALYSIS, ROUTINE W REFLEX MICROSCOPIC (NOT AT [facility]) - Abnormal; Notable for the following:    Color, Urine AMBER (*)    APPearance HAZY (*)    Hgb urine dipstick LARGE (*)    Protein, ur 100 (*)    All other components within normal limits  COMPREHENSIVE METABOLIC PANEL - Abnormal; Notable for the following:    Sodium 133 (*)    Potassium 3.1 (*)    Chloride 99 (*)    Glucose, Bld 112 (*)    Calcium 8.8 (*)    All other components within normal limits  URINE MICROSCOPIC-ADD ON - Abnormal; Notable for the following:    Bacteria, UA RARE (*)    All other components within normal limits  CBC WITH DIFFERENTIAL/PLATELET  SAMPLE TO BLOOD BANK    Imaging Review US Scrotum  05/30/2015  CLINICAL DATA:  Sudden onset right testicular pain and swelling. EXAM: SCROTAL ULTRASOUND DOPPLER ULTRASOUND OF THE TESTICLES TECHNIQUE: Complete ultrasound examination of the testicles, epididymis, and other scrotal structures was performed. Color and spectral Doppler ultrasound were also utilized to evaluate blood flow to the testicles. COMPARISON:  None. FINDINGS: Right testicle Measurements: 4.8 x 2.6 x 2 cm. No mass or microlithiasis visualized. Left testicle  Measurements: 4.9 x 3.2 x 2.1 cm. No microlithiasis. 3 x 2 x 3 mm avascular hyperechoic focus likely reflecting an area of dystrophic calcification. Right epididymis:  Normal in size .  3 mm right epididymal cyst. Left epididymis:  Normal in size.  4 mm left epididymal cyst. Hydrocele:  Small left hydrocele.  No right hydrocele. Varicocele:  None visualized. Pulsed Doppler interrogation of both testes demonstrates normal low resistance arterial and venous waveforms bilaterally. IMPRESSION: 1. No testicular torsion or testicular mass. 2. No orchitis or epididymitis. Electronically Signed   By: Elige Ko   On: 05/30/2015 10:56   [place] Art/ven Flow Abd Pelv Doppler  05/30/2015  CLINICAL DATA:  Sudden onset right testicular pain and swelling. EXAM: SCROTAL ULTRASOUND DOPPLER ULTRASOUND OF THE TESTICLES TECHNIQUE: Complete ultrasound examination of the testicles, epididymis, and other scrotal structures was performed. Color and spectral Doppler ultrasound were also utilized to evaluate blood flow to the testicles. COMPARISON:  None. FINDINGS: Right testicle Measurements: 4.8 x 2.6 x 2 cm. No mass or microlithiasis visualized.  Left testicle Measurements: 4.9 x 3.2 x 2.1 cm. No microlithiasis. 3 x 2 x 3 mm avascular hyperechoic focus likely reflecting an area of dystrophic calcification. Right epididymis:  Normal in size .  3 mm right epididymal cyst. Left epididymis:  Normal in size.  4 mm left epididymal cyst. Hydrocele:  Small left hydrocele.  No right hydrocele. Varicocele:  None visualized. Pulsed Doppler interrogation of both testes demonstrates normal low resistance arterial and venous waveforms bilaterally. IMPRESSION: 1. No testicular torsion or testicular mass. 2. No orchitis or epididymitis. Electronically Signed   By: Elige KoHetal  Patel   On: 05/30/2015 10:56   Ct Renal Stone Study  05/30/2015  CLINICAL DATA:  Right flank pain and right testicular pain and swelling since this morning. Fall 4 days ago with right  flank bruising. EXAM: CT ABDOMEN AND PELVIS WITHOUT CONTRAST TECHNIQUE: Multidetector CT imaging of the abdomen and pelvis was performed following the standard protocol without IV contrast. COMPARISON:  None. FINDINGS: Lung bases demonstrate subtle dependent bibasilar atelectasis. Abdominal images demonstrate the liver, spleen, pancreas, gallbladder and adrenal glands to be within normal. Kidneys are normal in size and demonstrate mild right-sided hydronephrosis. No definite nephrolithiasis. There is mild dilatation of the right ureter with a 3.4 mm stone at the right UVJ causing low-grade obstruction. Left ureter is normal. There is mild calcified plaque over the abdominal aorta and iliac arteries. Appendix is normal. Colon is within normal. Small bowel is unremarkable. No free fluid or focal inflammatory change. Pelvic images demonstrate the bladder, prostate and rectum to be normal. There are minimal degenerate changes of the spine. IMPRESSION: 3.4 mm stone at the right UVJ causing low-grade obstruction. Electronically Signed   By: Elberta Fortisaniel  Boyle M.D.   On: 05/30/2015 11:54   I have personally reviewed and evaluated these images and lab results as part of my medical decision-making.   EKG Interpretation None      MDM   Final diagnoses:  Right flank pain  Right ureteral stone    Patients  labs reviewed.  Radiological studies were viewed, interpreted and considered during the medical decision making and disposition process. I agree with radiologists reading.  Results were also discussed with patient.   Patient was given a urine strainer and instructed in its use.  Encouraged increase fluid intake.  Oxycodone prescribed.  Referral to urology for close follow-up this week.  He was given specific instruction on symptoms that would require emergent recheck including fevers, uncontrollable vomiting, worsened pain.  Patient agrees with and understands instructions.  He was pain-free at the time of  discharge.     Burgess AmorJulie Jacinta Penalver, PA-C 05/30/15 1242  Donnetta HutchingBrian Cook, MD 05/30/15 [phone]

## 2015-05-30 NOTE — ED Notes (Signed)
Pt hollared out  "why aren't ya'll doing anything to help me"

## 2015-05-30 NOTE — ED Notes (Signed)
Pt states he is calling his wife .  States "why are ya'll letting me lay here like this?"

## 2015-05-30 NOTE — ED Notes (Signed)
Pt in floor on knees.  States he can't stand it anymore.  MD notified.  Orders received.

## 2015-05-30 NOTE — ED Notes (Signed)
Pt continues to c/o pain to right flank and right testicle.  States " it feels like someone is squeezing it ".  Orders for more pain medicine.

## 2015-05-30 NOTE — ED Notes (Signed)
C/o right flank pain and right testicle pain swelling since 7 am.  States he fell against a machine 4 days ago.  Bruising to right flank.

## 2015-05-30 NOTE — ED Notes (Signed)
Pt awake texting on phone.  Pt states he feels 100% better.

## 2015-05-30 NOTE — ED Notes (Signed)
Dr. Adriana Simasook in to talk to pt.  Pt asleep.

## 2015-06-01 ENCOUNTER — Emergency Department (HOSPITAL_COMMUNITY)
Admission: EM | Admit: 2015-06-01 | Discharge: 2015-06-01 | Disposition: A | Discharge status: Home / Self Care | Payer: Self-pay | Attending: Emergency Medicine | Admitting: Emergency Medicine

## 2015-06-01 ENCOUNTER — Encounter (HOSPITAL_COMMUNITY): Payer: Self-pay

## 2015-06-01 DIAGNOSIS — F17210 Nicotine dependence, cigarettes, uncomplicated: Secondary | ICD-10-CM | POA: Insufficient documentation

## 2015-06-01 DIAGNOSIS — N200 Calculus of kidney: Secondary | ICD-10-CM | POA: Insufficient documentation

## 2015-06-01 LAB — CBC WITH DIFFERENTIAL/PLATELET
Basophils Absolute: 0 10*3/uL (ref 0.0–0.1)
Basophils Relative: 0 %
EOS ABS: 0 10*3/uL (ref 0.0–0.7)
EOS PCT: 0 %
HCT: 44.4 % (ref 39.0–52.0)
Hemoglobin: 15 g/dL (ref 13.0–17.0)
LYMPHS ABS: 1.1 10*3/uL (ref 0.7–4.0)
LYMPHS PCT: 9 %
MCH: 31.3 pg (ref 26.0–34.0)
MCHC: 33.8 g/dL (ref 30.0–36.0)
MCV: 92.7 fL (ref 78.0–100.0)
MONO ABS: 1.1 10*3/uL — AB (ref 0.1–1.0)
Monocytes Relative: 8 %
Neutro Abs: 10.6 10*3/uL — ABNORMAL HIGH (ref 1.7–7.7)
Neutrophils Relative %: 83 %
PLATELETS: 211 10*3/uL (ref 150–400)
RBC: 4.79 MIL/uL (ref 4.22–5.81)
RDW: 12.3 % (ref 11.5–15.5)
WBC: 12.8 10*3/uL — AB (ref 4.0–10.5)

## 2015-06-01 LAB — BASIC METABOLIC PANEL
Anion gap: 10 (ref 5–15)
BUN: 12 mg/dL (ref 6–20)
CO2: 25 mmol/L (ref 22–32)
CREATININE: 1.14 mg/dL (ref 0.61–1.24)
Calcium: 8.9 mg/dL (ref 8.9–10.3)
Chloride: 101 mmol/L (ref 101–111)
GFR calc Af Amer: >60 mL/min (ref >60)
GLUCOSE: 119 mg/dL — AB (ref 65–99)
POTASSIUM: 3.1 mmol/L — AB (ref 3.5–5.1)
SODIUM: 136 mmol/L (ref 135–145)

## 2015-06-01 MED ORDER — TAMSULOSIN HCL 0.4 MG PO CAPS: 0.4000 mg | ORAL_CAPSULE | Freq: Every day | ORAL | Status: AC

## 2015-06-01 MED ORDER — ONDANSETRON HCL 4 MG/2ML IJ SOLN
4.0000 mg | Freq: Once | INTRAMUSCULAR | Status: AC
  Administered 2015-06-01: 4 mg via INTRAVENOUS
  Filled 2015-06-01: qty 2

## 2015-06-01 MED ORDER — OXYCODONE-ACETAMINOPHEN 5-325 MG PO TABS: 1.0000 | ORAL_TABLET | Freq: Four times a day (QID) | ORAL | Status: AC | PRN

## 2015-06-01 MED ORDER — SODIUM CHLORIDE 0.9 % IV BOLUS (SEPSIS)
1000.0000 mL | Freq: Once | INTRAVENOUS | Status: AC
  Administered 2015-06-01: 1000 mL via INTRAVENOUS

## 2015-06-01 MED ORDER — MORPHINE SULFATE (PF) 4 MG/ML IV SOLN
4.0000 mg | Freq: Once | INTRAVENOUS | Status: AC
  Administered 2015-06-01: 4 mg via INTRAVENOUS
  Filled 2015-06-01: qty 1

## 2015-06-01 MED ORDER — KETOROLAC TROMETHAMINE 30 MG/ML IJ SOLN
30.0000 mg | Freq: Once | INTRAMUSCULAR | Status: AC
  Administered 2015-06-01: 30 mg via INTRAVENOUS
  Filled 2015-06-01: qty 1

## 2015-06-01 MED ORDER — KETOROLAC TROMETHAMINE 10 MG PO TABS: 10.0000 mg | ORAL_TABLET | Freq: Four times a day (QID) | ORAL | Status: AC | PRN

## 2015-06-01 NOTE — Discharge Instructions (Signed)
Toradol and Flomax as prescribed.  Percocet as prescribed as needed for pain.  Follow-up with urology if not improving in the next 1-2 days. Return to the ER if you develop high fever, worsening pain, or other new and concerning symptoms.   Kidney Stones Kidney stones (urolithiasis) are deposits that form inside your kidneys. The intense pain is caused by the stone moving through the urinary tract. When the stone moves, the ureter goes into spasm around the stone. The stone is usually passed in the urine.  CAUSES   A disorder that makes certain neck glands produce too much parathyroid hormone (primary hyperparathyroidism).  A buildup of uric acid crystals, similar to gout in your joints.  Narrowing (stricture) of the ureter.  A kidney obstruction present at birth (congenital obstruction).  Previous surgery on the kidney or ureters.  Numerous kidney infections. SYMPTOMS   Feeling sick to your stomach (nauseous).  Throwing up (vomiting).  Blood in the urine (hematuria).  Pain that usually spreads (radiates) to the groin.  Frequency or urgency of urination. DIAGNOSIS   Taking a history and physical exam.  Blood or urine tests.  CT scan.  Occasionally, an examination of the inside of the urinary bladder (cystoscopy) is performed. TREATMENT   Observation.  Increasing your fluid intake.  Extracorporeal shock wave lithotripsy--This is a noninvasive procedure that uses shock waves to break up kidney stones.  Surgery may be needed if you have severe pain or persistent obstruction. There are various surgical procedures. Most of the procedures are performed with the use of small instruments. Only small incisions are needed to accommodate these instruments, so recovery time is minimized. The size, location, and chemical composition are all important variables that will determine the proper choice of action for you. Talk to your health care provider to better understand your  situation so that you will minimize the risk of injury to yourself and your kidney.  HOME CARE INSTRUCTIONS   Drink enough water and fluids to keep your urine clear or pale yellow. This will help you to pass the stone or stone fragments.  Strain all urine through the provided strainer. Keep all particulate matter and stones for your health care provider to see. The stone causing the pain may be as small as a grain of salt. It is very important to use the strainer each and every time you pass your urine. The collection of your stone will allow your health care provider to analyze it and verify that a stone has actually passed. The stone analysis will often identify what you can do to reduce the incidence of recurrences.  Only take over-the-counter or prescription medicines for pain, discomfort, or fever as directed by your health care provider.  Keep all follow-up visits as told by your health care provider. This is important.  Get follow-up X-rays if required. The absence of pain does not always mean that the stone has passed. It may have only stopped moving. If the urine remains completely obstructed, it can cause loss of kidney function or even complete destruction of the kidney. It is your responsibility to make sure X-rays and follow-ups are completed. Ultrasounds of the kidney can show blockages and the status of the kidney. Ultrasounds are not associated with any radiation and can be performed easily in a matter of minutes.  Make changes to your daily diet as told by your health care provider. You may be told to:  Limit the amount of salt that you eat.  Eat 5  or more servings of fruits and vegetables each day.  Limit the amount of meat, poultry, fish, and eggs that you eat.  Collect a 24-hour urine sample as told by your health care provider.You may need to collect another urine sample every 6-12 months. SEEK MEDICAL CARE IF:  You experience pain that is progressive and unresponsive to  any pain medicine you have been prescribed. SEEK IMMEDIATE MEDICAL CARE IF:   Pain cannot be controlled with the prescribed medicine.  You have a fever or shaking chills.  The severity or intensity of pain increases over 18 hours and is not relieved by pain medicine.  You develop a new onset of abdominal pain.  You feel faint or pass out.  You are unable to urinate.   This information is not intended to replace advice given to you by your health care provider. Make sure you discuss any questions you have with your health care provider.   Document Released: 01/03/2005 Document Revised: 09/24/2014 Document Reviewed: 06/06/2012 Elsevier Interactive Patient Education [employer]

## 2015-06-01 NOTE — ED Provider Notes (Signed)
CSN: [id number]     Arrival date & time 06/01/15  1319 History   First MD Initiated Contact with Patient 06/01/15 1352     Chief Complaint  Patient presents with  . Flank Pain     (Consider location/radiation/quality/duration/timing/severity/associated sxs/prior Treatment) HPI Comments: Patient is a 50 year old male with no significant past medical history. He presents for evaluation of right flank pain. He was seen here with similar complaints 2 days ago during which time he underwent a CT scan showing a calculus measuring 3-4 mm in the right UVJ. He returns today with ongoing discomfort. He denies any fevers or chills. He denies any vomiting or diarrhea. He does feel nauseated.  Patient is a 50 y.o. male presenting with flank pain. The history is provided by the patient.  Flank Pain This is a new problem. Episode onset: 3 days ago. The problem occurs constantly. The problem has not changed since onset.Nothing aggravates the symptoms. Nothing relieves the symptoms. He has tried nothing for the symptoms.    Past Medical History  Diagnosis Date  . Tibia/fibula fracture    Past Surgical History  Procedure Laterality Date  . Colonoscopy    . Bronchoscopy    . Orif ulnar fracture Right 08/10/2012    Procedure: OPEN REDUCTION INTERNAL FIXATION (ORIF) ULNAR FRACTURE;  Surgeon: Darreld Mclean, MD;  Location: [facility];  Service: Orthopedics;  Laterality: Right;   No family history on file. Social History  Substance Use Topics  . Smoking status: Current Every Day Smoker -- 1.00 packs/day for 20 years    Types: Cigarettes  . Smokeless tobacco: None  . Alcohol Use: Yes     Comment: occasional     Review of Systems  Genitourinary: Positive for flank pain.  All other systems reviewed and are negative.     Allergies  Review of patient's allergies indicates no known allergies.  Home Medications   Prior to Admission medications   Medication Sig Start Date End Date Taking? Authorizing  Provider  acetaminophen (TYLENOL) 500 MG tablet Take 1,000 mg by mouth every 6 (six) hours as needed for mild pain or moderate pain.    Historical Provider, MD  cephALEXin (KEFLEX) 500 MG capsule Take 1 capsule (500 mg total) by mouth 3 (three) times daily. Patient not taking: Reported on 05/30/2015 12/10/14   Azalia Bilis, MD  diphenhydramine-acetaminophen (TYLENOL PM) 25-500 MG TABS Take 1-2 tablets by mouth at bedtime as needed (pain/sleep).     Historical Provider, MD  naproxen sodium (ANAPROX) 220 MG tablet Take 220 mg by mouth daily as needed (pain).    Historical Provider, MD  ondansetron (ZOFRAN ODT) 8 MG disintegrating tablet Take 1 tablet (8 mg total) by mouth every 8 (eight) hours as needed for nausea or vomiting. 05/30/15   Burgess Amor, PA-C  oxyCODONE-acetaminophen (PERCOCET/ROXICET) 5-325 MG tablet Take 1 tablet by mouth every 4 (four) hours as needed. 05/30/15   Burgess Amor, PA-C   BP 127/75 mmHg  Pulse 64  Temp(Src) 98.8 F (37.1 C) (Oral)  Resp 24  Ht  (1.778 m)  Wt 152 lb (68.947 kg)  BMI 21.81 kg/m2  SpO2 97% Physical Exam  Constitutional: He is oriented to person, place, and time. He appears well-developed and well-nourished. No distress.  HENT:  Head: Normocephalic and atraumatic.  Mouth/Throat: Oropharynx is clear and moist.  Neck: Normal range of motion. Neck supple.  Cardiovascular: Normal rate and regular rhythm.  Exam reveals no friction rub.   No murmur heard. Pulmonary/Chest:  Effort normal and breath sounds normal. No respiratory distress. He has no wheezes. He has no rales.  Abdominal: Soft. Bowel sounds are normal. He exhibits no distension. There is tenderness. There is no rebound and no guarding.  There is tenderness in the right upper quadrant and right flank.  Musculoskeletal: Normal range of motion. He exhibits no edema.  Neurological: He is alert and oriented to person, place, and time. Coordination normal.  Skin: Skin is warm and dry. He is not  diaphoretic.  Nursing note and vitals reviewed.   ED Course  Procedures (including critical care time) Labs Review Labs Reviewed  URINALYSIS, ROUTINE W REFLEX MICROSCOPIC (NOT [facility])  BASIC METABOLIC PANEL  CBC WITH DIFFERENTIAL/PLATELET    Imaging Review No results found. I have personally reviewed and evaluated these images and lab results as part of my medical decision-making.    MDM   Final diagnoses:  None    Patient presents with complaints of ongoing right flank pain. He was seen here 2 days ago and diagnosed with a kidney stone. He ran out of his pain medication and his pain is now unbearable. He does not appear septic or toxic. He is afebrile. He does have a slight white count which I feel is related to pain/stress. He will be discharged with Flomax, pain medication, Toradol, and when necessary return.    Geoffery Lyonsouglas Lajune Perine, MD 06/01/15 [phone]

## 2015-06-01 NOTE — ED Notes (Addendum)
Pt seen here Saturday for kidney stone. Pain increasing in right flank area. States difficulty with urination. Pain unrelieved with medication

## 2015-06-01 NOTE — ED Notes (Signed)
Patient verbalizes understanding of discharge instructions, prescriptions, home care and follow up care. Patient out of department at this time. 

## 2016-07-11 IMAGING — CT CT RENAL STONE PROTOCOL
2 of 4 series · 16 of 46 positions shown, 18 images · non-contrast
Comparison: None.

CLINICAL DATA: Right flank pain and right testicular pain and
swelling since this morning. Fall 4 days ago with right flank
bruising.

EXAM:
CT ABDOMEN AND PELVIS WITHOUT CONTRAST
TECHNIQUE: Multidetector CT imaging of the abdomen and pelvis was performed
following the standard protocol without IV contrast.

[Series 2: standard/full over (age)lbs 5.0 · axial · 0.66mm/px · z∈[-452,-17]mm · 13 of 95 slices shown, 15 images]
[im 4/95  soft-tissue]
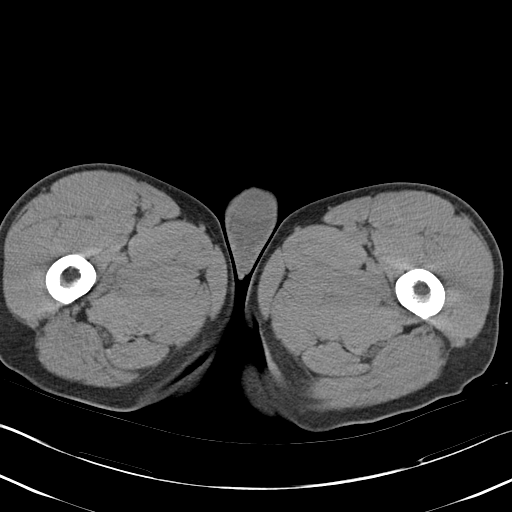
[im 4/95  bone]
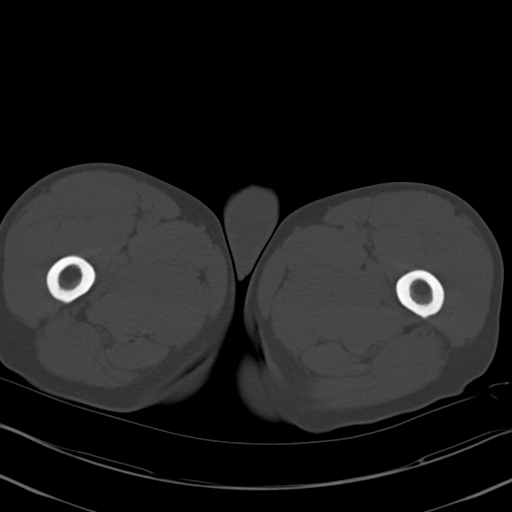
[im 11/95  soft-tissue]
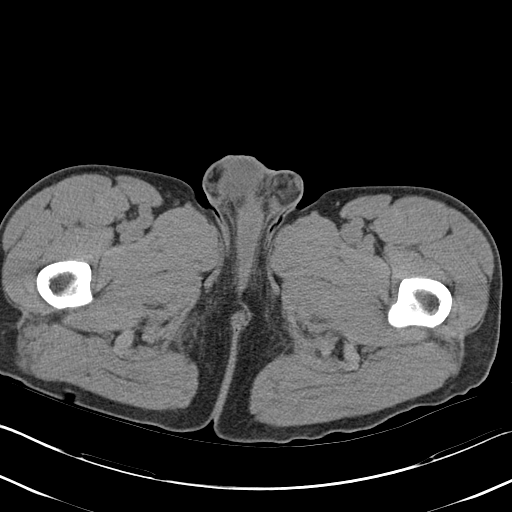
[im 19/95  soft-tissue]
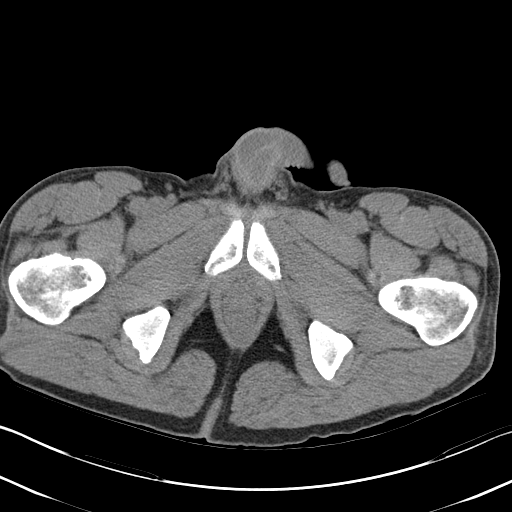
[im 26/95  soft-tissue]
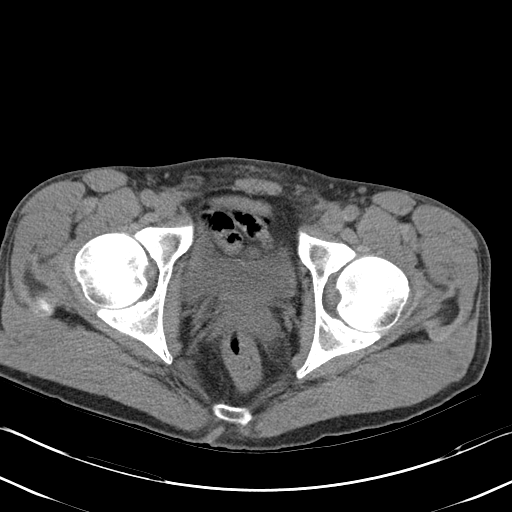
[im 33/95  soft-tissue]
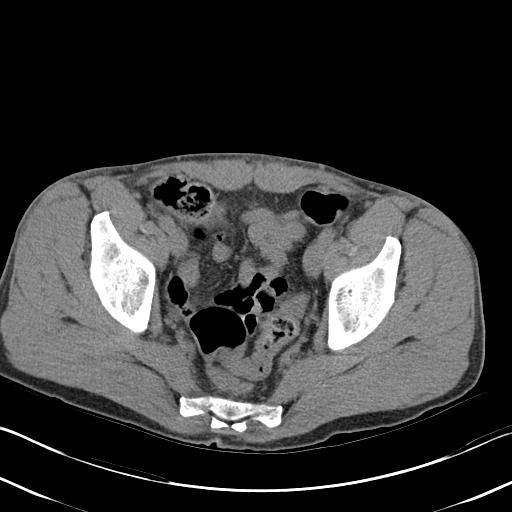
[im 40/95  soft-tissue]
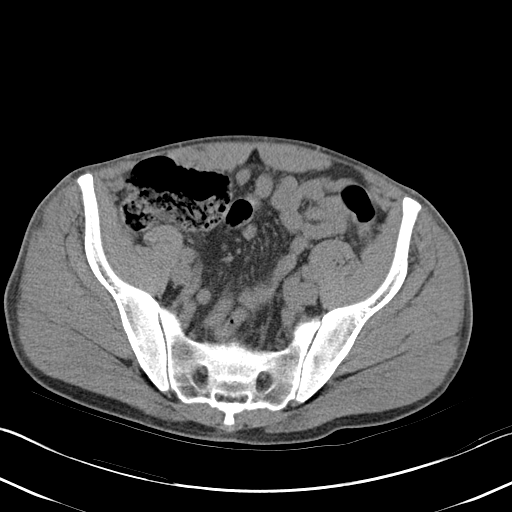
[im 48/95  soft-tissue]
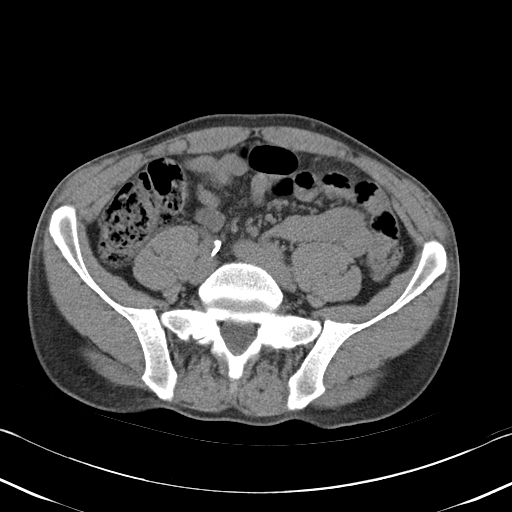
[im 55/95  soft-tissue]
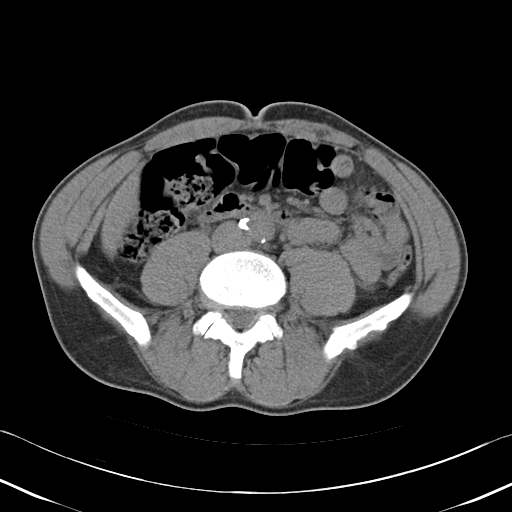
[im 62/95  soft-tissue]
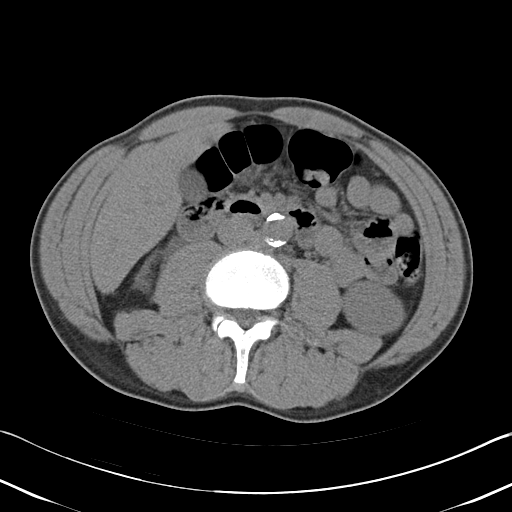
[im 62/95  bone]
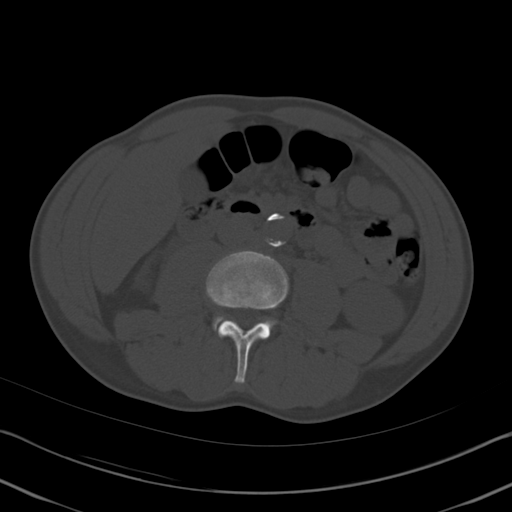
[im 69/95  soft-tissue]
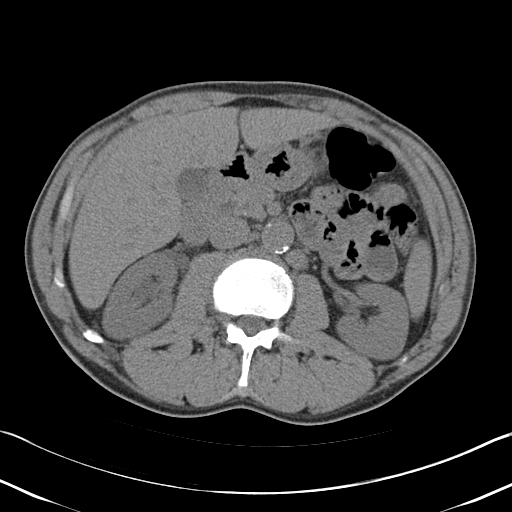
[im 76/95  soft-tissue]
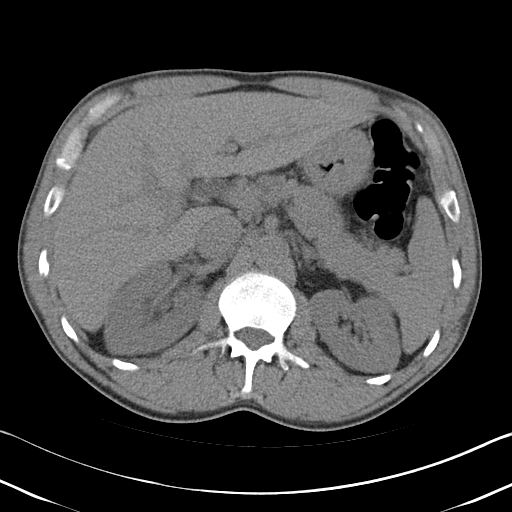
[im 84/95  soft-tissue]
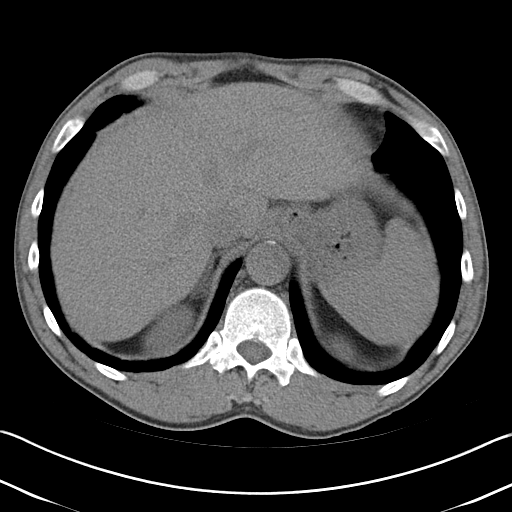
[im 91/95  soft-tissue]
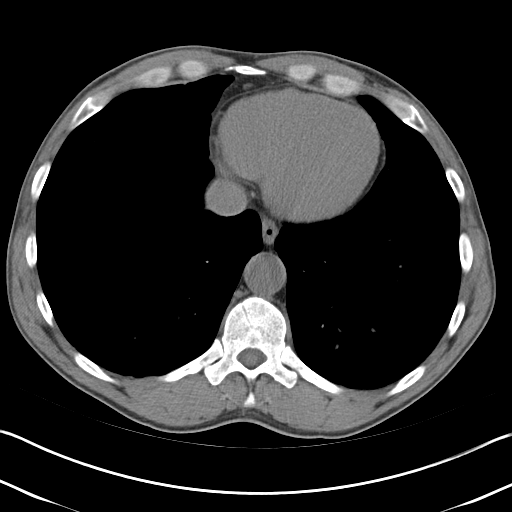

[Series 3: mpr coronal · coronal · 0.72mm/px · 3 of 95 slices shown]
[im 32/95  soft-tissue]
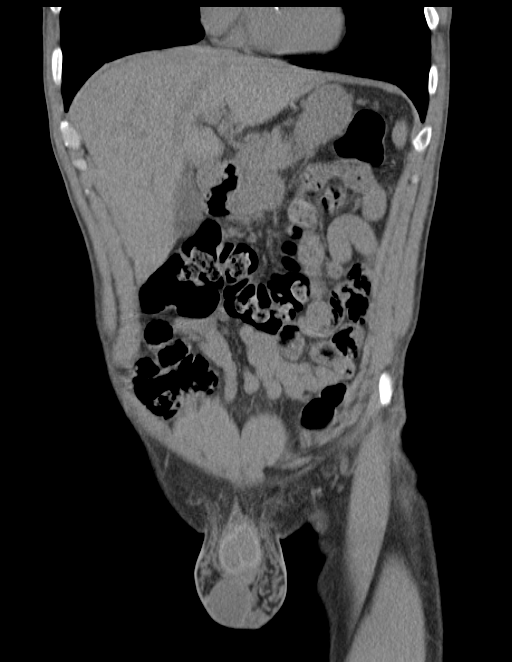
[im 42/95  soft-tissue]
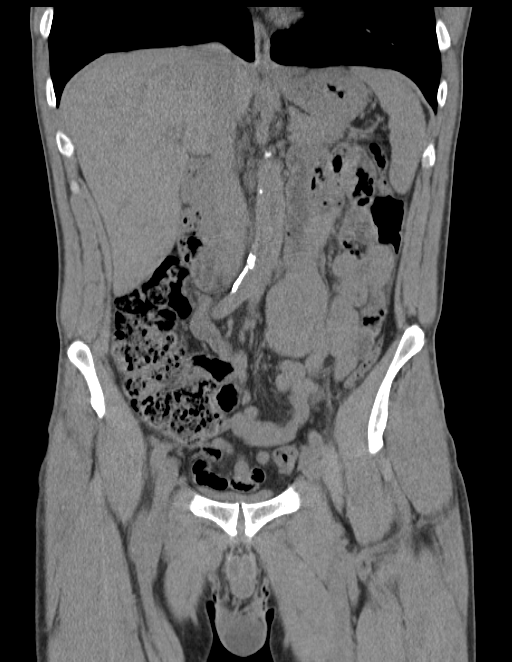
[im 53/95  soft-tissue]
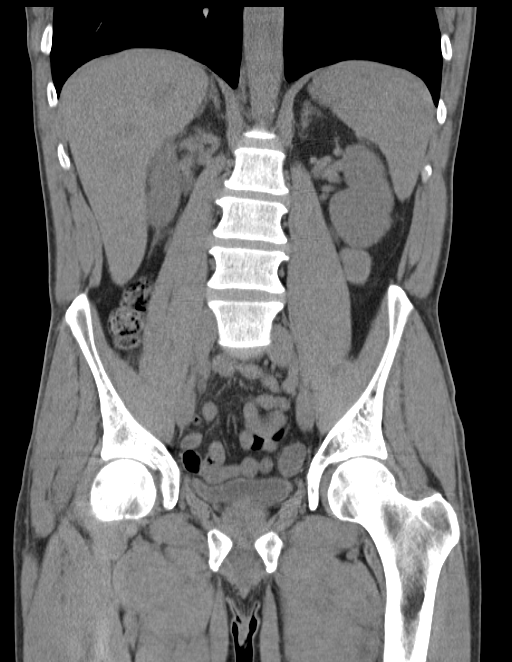

[16 of 46 positions shown; findings below may reference images not displayed]

FINDINGS: Lung bases demonstrate subtle dependent bibasilar atelectasis.

Abdominal images demonstrate the liver, spleen, pancreas,
gallbladder and adrenal glands to be within normal.

Kidneys are normal in size and demonstrate mild right-sided
hydronephrosis. No definite nephrolithiasis. There is mild
dilatation of the right ureter with a 3.4 mm stone at the right UVJ
causing low-grade obstruction. Left ureter is normal.

There is mild calcified plaque over the abdominal aorta and iliac
arteries.

Appendix is normal. Colon is within normal. Small bowel is
unremarkable. No free fluid or focal inflammatory change.

Pelvic images demonstrate the bladder, prostate and rectum to be
normal.

There are minimal degenerate changes of the spine.
IMPRESSION: 3.4 mm stone at the right UVJ causing low-grade obstruction.

## 2017-01-06 ENCOUNTER — Emergency Department (HOSPITAL_COMMUNITY)
Admission: EM | Admit: 2017-01-06 | Discharge: 2017-01-06 | Disposition: A | Discharge status: Home / Self Care | Payer: Self-pay | Attending: Emergency Medicine | Admitting: Emergency Medicine

## 2017-01-06 ENCOUNTER — Encounter (HOSPITAL_COMMUNITY): Payer: Self-pay | Admitting: Emergency Medicine

## 2017-01-06 DIAGNOSIS — I952 Hypotension due to drugs: Secondary | ICD-10-CM | POA: Insufficient documentation

## 2017-01-06 DIAGNOSIS — Z79899 Other long term (current) drug therapy: Secondary | ICD-10-CM | POA: Insufficient documentation

## 2017-01-06 DIAGNOSIS — F17210 Nicotine dependence, cigarettes, uncomplicated: Secondary | ICD-10-CM | POA: Insufficient documentation

## 2017-01-06 LAB — ETHANOL

## 2017-01-06 LAB — COMPREHENSIVE METABOLIC PANEL
ALT: 23 U/L (ref 17–63)
ANION GAP: 9 (ref 5–15)
AST: 22 U/L (ref 15–41)
Albumin: 3.6 g/dL (ref 3.5–5.0)
Alkaline Phosphatase: 61 U/L (ref 38–126)
BILIRUBIN TOTAL: 0.7 mg/dL (ref 0.3–1.2)
BUN: 15 mg/dL (ref 6–20)
CO2: 24 mmol/L (ref 22–32)
Calcium: 8.5 mg/dL — ABNORMAL LOW (ref 8.9–10.3)
Chloride: 103 mmol/L (ref 101–111)
Creatinine, Ser: 0.73 mg/dL (ref 0.61–1.24)
GFR calc non Af Amer: >60 mL/min (ref >60)
Glucose, Bld: 95 mg/dL (ref 65–99)
POTASSIUM: 3.3 mmol/L — AB (ref 3.5–5.1)
Sodium: 136 mmol/L (ref 135–145)
TOTAL PROTEIN: 6.2 g/dL — AB (ref 6.5–8.1)

## 2017-01-06 LAB — URINALYSIS, ROUTINE W REFLEX MICROSCOPIC
Bilirubin Urine: NEGATIVE
Glucose, UA: NEGATIVE mg/dL
Hgb urine dipstick: NEGATIVE
KETONES UR: NEGATIVE mg/dL
LEUKOCYTES UA: NEGATIVE
NITRITE: NEGATIVE
PH: 6 (ref 5.0–8.0)
Protein, ur: NEGATIVE mg/dL
Specific Gravity, Urine: 1.005 (ref 1.005–1.030)

## 2017-01-06 LAB — CBC WITH DIFFERENTIAL/PLATELET
BASOS ABS: 0.1 10*3/uL (ref 0.0–0.1)
BASOS PCT: 1 %
Eosinophils Absolute: 0.1 10*3/uL (ref 0.0–0.7)
Eosinophils Relative: 1 %
HEMATOCRIT: 41.9 % (ref 39.0–52.0)
HEMOGLOBIN: 14.6 g/dL (ref 13.0–17.0)
Lymphocytes Relative: 37 %
Lymphs Abs: 3.2 10*3/uL (ref 0.7–4.0)
MCH: 30.6 pg (ref 26.0–34.0)
MCHC: 34.8 g/dL (ref 30.0–36.0)
MCV: 87.8 fL (ref 78.0–100.0)
MONO ABS: 0.9 10*3/uL (ref 0.1–1.0)
Monocytes Relative: 11 %
NEUTROS ABS: 4.3 10*3/uL (ref 1.7–7.7)
Neutrophils Relative %: 50 %
Platelets: 262 10*3/uL (ref 150–400)
RBC: 4.77 MIL/uL (ref 4.22–5.81)
RDW: 12.2 % (ref 11.5–15.5)
WBC: 8.5 10*3/uL (ref 4.0–10.5)

## 2017-01-06 LAB — RAPID URINE DRUG SCREEN, HOSP PERFORMED
Amphetamines: NOT DETECTED
BARBITURATES: NOT DETECTED
BENZODIAZEPINES: NOT DETECTED
COCAINE: NOT DETECTED
Opiates: NOT DETECTED
Tetrahydrocannabinol: NOT DETECTED

## 2017-01-06 MED ORDER — SODIUM CHLORIDE 0.9 % IV BOLUS (SEPSIS)
1000.0000 mL | Freq: Once | INTRAVENOUS | Status: AC
  Administered 2017-01-06: 1000 mL via INTRAVENOUS

## 2017-01-06 NOTE — ED Notes (Signed)
Pt feeling much better at this time.  Given po fluids.

## 2017-01-06 NOTE — Discharge Instructions (Signed)
As discussed, your evaluation today has been largely reassuring.  But, it is important that you monitor your condition carefully, and do not hesitate to return to the ED if you develop new, or concerning changes in your condition. ? ?Otherwise, please follow-up with your physician for appropriate ongoing care. ? ?

## 2017-01-06 NOTE — ED Provider Notes (Signed)
[facility] Provider Note   CSN: [id number] Arrival date & time: 01/06/17  1424     History   Chief Complaint Chief Complaint  Patient presents with  . Hypotension    HPI Evan Valdez is a 51 y.o. male.  HPI  [facility] of [facility]. The patient himself states that he is a generally healthy male. Patient acknowledges a history of chronic pain, for which she takes opiates daily. Today, as part of an opiate withdrawal protocol he received clonidine. Soon after the patient felt lightheaded, and evaluation at the facility found him to be hypotensive. No report of fall, no report of complete syncope, the patient denies new pain. On arrival here he has awake and alert, though he states that he still feels weak. [facility].   Past Medical History:  Diagnosis Date  . Tibia/fibula fracture     There are no active problems to display for this patient.   Past Surgical History:  Procedure Laterality Date  . BRONCHOSCOPY    . COLONOSCOPY    . ORIF ULNAR FRACTURE Right 08/10/2012   Procedure: OPEN REDUCTION INTERNAL FIXATION (ORIF) ULNAR FRACTURE;  Surgeon: Darreld McleanWayne Keeling, MD;  Location: [facility];  Service: Orthopedics;  Laterality: Right;       Home Medications    Prior to Admission medications   Medication Sig Start Date End Date Taking? Authorizing Provider  acetaminophen (TYLENOL) 500 MG tablet Take 1,000 mg by mouth every 6 (six) hours as needed for mild pain or moderate pain.    [provider]  cephALEXin (KEFLEX) 500 MG capsule Take 1 capsule (500 mg total) by mouth 3 (three) times daily. Patient not taking: Reported on 05/30/2015 12/10/14   Azalia Bilisampos, Kevin, MD  diphenhydramine-acetaminophen (TYLENOL PM) 25-500 MG TABS Take 1-2 tablets by mouth at bedtime as needed (pain/sleep).     [provider]  ketorolac (TORADOL) 10 MG tablet Take 1  tablet (10 mg total) by mouth every 6 (six) hours as needed. 06/01/15   Geoffery Lyonselo, Douglas, MD  naproxen sodium (ANAPROX) 220 MG tablet Take 220 mg by mouth daily as needed (pain). Reported on 06/01/2015    [provider]  ondansetron (ZOFRAN ODT) 8 MG disintegrating tablet Take 1 tablet (8 mg total) by mouth every 8 (eight) hours as needed for nausea or vomiting. 05/30/15   Idol, Raynelle FanningJulie, PA-C  oxyCODONE-acetaminophen (PERCOCET) 5-325 MG tablet Take 1-2 tablets by mouth every 6 (six) hours as needed. 06/01/15   Geoffery Lyonselo, Douglas, MD  tamsulosin (FLOMAX) 0.4 MG CAPS capsule Take 1 capsule (0.4 mg total) by mouth daily. 06/01/15   Geoffery Lyonselo, Douglas, MD    Family History History reviewed. No pertinent family history.  Social History Social History   Tobacco Use  . Smoking status: Current Every Day Smoker    Packs/day: 1.00    Years: 20.00    Pack years: 20.00    Types: Cigarettes  Substance Use Topics  . Alcohol use: Yes    Comment: occasional   . Drug use: Yes    Types: Marijuana, Cocaine, Methamphetamines    Comment: heroin     Allergies   Patient has no known allergies.   Review of Systems Review of Systems  Constitutional:       Per HPI, otherwise negative  HENT:       Per HPI, otherwise negative  Respiratory:       Per HPI, otherwise  negative  Cardiovascular:       Per HPI, otherwise negative  Gastrointestinal: Negative for vomiting.  Endocrine:       Negative aside from HPI  Genitourinary:       Neg aside from HPI   Musculoskeletal:       Per HPI, otherwise negative  Skin: Negative.   Neurological: Positive for weakness. Negative for syncope.     Physical Exam Updated Vital Signs BP 108/65   Pulse (!) 53   Temp 97.8 F (36.6 C) (Oral)   Resp 19   Ht 5\' 10"  (1.778 m)   Wt 72.6 kg (160 lb)   SpO2 100%   BMI 22.96 kg/m   Physical Exam  Constitutional: He is oriented to person, place, and time. He appears well-developed. No distress.  HENT:  Head:  Normocephalic and atraumatic.  Eyes: Conjunctivae and EOM are normal.  Cardiovascular: Normal rate and regular rhythm.  Pulmonary/Chest: Effort normal. No stridor. No respiratory distress.  Abdominal: He exhibits no distension.  Musculoskeletal: He exhibits no edema.  Neurological: He is alert and oriented to person, place, and time.  Skin: Skin is warm and dry.  Psychiatric: He has a normal mood and affect.  Nursing note and vitals reviewed.    ED Treatments / Results  Labs (all labs ordered are listed, but only abnormal results are displayed) Labs Reviewed  COMPREHENSIVE METABOLIC PANEL - Abnormal; Notable for the following components:      Result Value   Potassium 3.3 (*)    Calcium 8.5 (*)    Total Protein 6.2 (*)    All other components within normal limits  ETHANOL  CBC WITH DIFFERENTIAL/PLATELET  URINALYSIS, ROUTINE W REFLEX MICROSCOPIC  RAPID URINE DRUG SCREEN, HOSP PERFORMED    Procedures Procedures (including critical care time)  Medications Ordered in ED Medications  sodium chloride 0.9 % bolus 1,000 mL (0 mLs Intravenous Stopped 01/06/17 1641)     Initial Impression / Assessment and Plan / ED Course  I have reviewed the triage vital signs and the nursing notes.  Pertinent labs & imaging results that were available during my care of the patient were reviewed by me and considered in my medical decision making (see chart for details).  4:48 PM Following 2 L fluid resuscitation the patient states that he feels substantially better, looks better, blood pressure has returned to a normal value.  This 51 year old male with a history of chronic pain, dependent on opiate presents after an episode of weakness, found to be hypotensive. Patient is improving with fluids, and passage of time since provision of clonidine, and antihypertensive medication suggests iatrogenic hypotension. No evidence for infection, hemorrhage. With his improvement he was discharged in stable  condition.  Final Clinical Impressions(s) / ED Diagnoses   Final diagnoses:  Hypotension due to drugs    ED Discharge Orders    None       Gerhard MunchLockwood, Dannae Kato, MD 01/06/17 1649

## 2017-01-06 NOTE — ED Triage Notes (Signed)
[facility] [place] [facility] of [facility] [place] after being given Clonidine 0.2mg  and Vistaril as part of heroin withdrawal protocol.
